# Patient Record
Sex: Female | Born: 1950 | Race: White | Hispanic: No | Marital: Married | Smoking: Never smoker
Health system: Southern US, Community
[De-identification: ages and names within clinical notes are randomized; demographics above are authoritative.]

## PROBLEM LIST (undated history)

## (undated) DIAGNOSIS — T7840XA Allergy, unspecified, initial encounter: Secondary | ICD-10-CM

## (undated) DIAGNOSIS — F419 Anxiety disorder, unspecified: Secondary | ICD-10-CM

## (undated) HISTORY — DX: Anxiety disorder, unspecified: F41.9

## (undated) HISTORY — DX: Allergy, unspecified, initial encounter: T78.40XA

## (undated) HISTORY — PX: EYE SURGERY: SHX253

---

## 2001-08-06 ENCOUNTER — Other Ambulatory Visit: Admission: RE | Admit: 2001-08-06 | Discharge: 2001-08-06 | Payer: Self-pay | Admitting: Family Medicine

## 2004-05-30 HISTORY — PX: APPENDECTOMY: SHX54

## 2005-03-09 ENCOUNTER — Ambulatory Visit: Payer: Self-pay | Admitting: Family Medicine

## 2005-03-14 LAB — HM DEXA SCAN

## 2005-06-03 ENCOUNTER — Ambulatory Visit: Payer: Self-pay

## 2005-06-03 ENCOUNTER — Inpatient Hospital Stay: Payer: Self-pay | Admitting: General Surgery

## 2005-07-13 ENCOUNTER — Ambulatory Visit: Payer: Self-pay | Admitting: General Surgery

## 2006-04-04 ENCOUNTER — Ambulatory Visit: Payer: Self-pay | Admitting: Family Medicine

## 2007-04-23 ENCOUNTER — Ambulatory Visit: Payer: Self-pay | Admitting: Family Medicine

## 2008-06-03 ENCOUNTER — Ambulatory Visit: Payer: Self-pay | Admitting: Family Medicine

## 2009-10-07 ENCOUNTER — Ambulatory Visit: Payer: Self-pay | Admitting: Family Medicine

## 2011-01-25 ENCOUNTER — Ambulatory Visit: Payer: Self-pay | Admitting: Family Medicine

## 2011-11-04 LAB — HM PAP SMEAR: HM Pap smear: NEGATIVE

## 2012-01-26 ENCOUNTER — Ambulatory Visit: Payer: Self-pay | Admitting: Family Medicine

## 2012-01-31 ENCOUNTER — Ambulatory Visit: Payer: Self-pay | Admitting: Family Medicine

## 2013-02-04 ENCOUNTER — Ambulatory Visit: Payer: Self-pay | Admitting: Family Medicine

## 2013-02-13 ENCOUNTER — Ambulatory Visit: Payer: Self-pay | Admitting: Family Medicine

## 2013-08-30 ENCOUNTER — Ambulatory Visit: Payer: Self-pay | Admitting: Family Medicine

## 2014-04-22 ENCOUNTER — Ambulatory Visit: Payer: Self-pay | Admitting: Family Medicine

## 2014-04-22 LAB — HM MAMMOGRAPHY

## 2014-09-25 LAB — HM COLONOSCOPY

## 2015-01-14 ENCOUNTER — Other Ambulatory Visit: Payer: Self-pay | Admitting: Family Medicine

## 2015-01-15 NOTE — Telephone Encounter (Signed)
This is a pt of Dr. Santiago Bur  Last ov was on 04/02/2014 and the next ov appointment is on 04/06/2015.  Thanks,

## 2015-01-21 ENCOUNTER — Other Ambulatory Visit: Payer: Self-pay

## 2015-01-21 DIAGNOSIS — G4700 Insomnia, unspecified: Secondary | ICD-10-CM

## 2015-01-21 DIAGNOSIS — E7800 Pure hypercholesterolemia, unspecified: Secondary | ICD-10-CM | POA: Insufficient documentation

## 2015-01-21 DIAGNOSIS — K5790 Diverticulosis of intestine, part unspecified, without perforation or abscess without bleeding: Secondary | ICD-10-CM | POA: Insufficient documentation

## 2015-01-21 DIAGNOSIS — F419 Anxiety disorder, unspecified: Secondary | ICD-10-CM | POA: Insufficient documentation

## 2015-01-21 MED ORDER — ZOLPIDEM TARTRATE 10 MG PO TABS: 5.0000 mg | ORAL_TABLET | Freq: Every day | ORAL | Status: DC

## 2015-01-21 NOTE — Telephone Encounter (Signed)
Last OV 04/02/2014

## 2015-04-06 ENCOUNTER — Ambulatory Visit (INDEPENDENT_AMBULATORY_CARE_PROVIDER_SITE_OTHER): Payer: BC Managed Care – PPO | Admitting: Family Medicine

## 2015-04-06 ENCOUNTER — Encounter: Payer: Self-pay | Admitting: Family Medicine

## 2015-04-06 VITALS — BP 106/74 | HR 68 | Temp 97.4°F | Resp 16 | Ht 61.0 in | Wt 110.0 lb

## 2015-04-06 DIAGNOSIS — Z0000 Encounter for general adult medical examination without abnormal findings: Secondary | ICD-10-CM | POA: Diagnosis not present

## 2015-04-06 DIAGNOSIS — E7800 Pure hypercholesterolemia, unspecified: Secondary | ICD-10-CM

## 2015-04-06 DIAGNOSIS — Z1159 Encounter for screening for other viral diseases: Secondary | ICD-10-CM

## 2015-04-06 LAB — POCT URINALYSIS DIPSTICK
Bilirubin, UA: NEGATIVE
GLUCOSE UA: NEGATIVE
KETONES UA: NEGATIVE
Leukocytes, UA: NEGATIVE
NITRITE UA: NEGATIVE
PH UA: 6
PROTEIN UA: NEGATIVE
RBC UA: NEGATIVE
Spec Grav, UA: 1.02
UROBILINOGEN UA: 0.2

## 2015-04-06 NOTE — Progress Notes (Signed)
Patient ID: JUDYTH DEMARAIS, female   DOB: 07/02/1950, 64 y.o.   MRN: [id number]       Patient: Robin Stephens, Female    DOB: Jul 16, 1950, 76 y.o.   MRN: [id number] Visit Date: 04/06/2015  Today's Provider: Lorie Phenix, MD   Chief Complaint  Patient presents with  . Annual Exam   Subjective:    Annual physical exam Robin Stephens is a 64 y.o. female who presents today for health maintenance and complete physical. She feels well. She reports exercising daily. She reports she is sleeping fairly well.  Patient reports that she does have some issues sleeping. Patient reports that she sleeps better with 1/2 tablet of Ambien.  Sometimes leaves a quarter of tab by her bedside to take as needed. This works without any grogginess.   ----------------------------------------------------------------- Last: Colonoscopy- 09/25/2014, normal  Pap- 11/04/2011, normal; HPV negative.  Tdap- 01/01/2013  Zoster- 11/28/2010  BMD- 03/14/2005, normal.  Mammogram- 04/22/2014, normal.    Review of Systems  Constitutional: Negative.   HENT: Negative.   Eyes: Negative.   Respiratory: Negative.   Cardiovascular: Negative.   Gastrointestinal: Negative.   Endocrine: Negative.   Genitourinary: Negative.   Musculoskeletal: Negative.   Skin: Negative.   Allergic/Immunologic: Negative.   Neurological: Negative.   Hematological: Negative.   Psychiatric/Behavioral: Negative.     Social History She  reports that she has never smoked. She does not have any smokeless tobacco history on file. She reports that she does not drink alcohol or use illicit drugs. Social History   Social History  . Marital Status: Married    Spouse Name: N/A  . Number of Children: N/A  . Years of Education: 12   Occupational History  . retired [employer]   Social History Main Topics  . Smoking status: Never Smoker   . Smokeless tobacco: None  . Alcohol Use: No  . Drug Use: No  . Sexual Activity: Not Asked    Other Topics Concern  . None   Social History Narrative    Patient Active Problem List   Diagnosis Date Noted  . Anxiety 01/21/2015  . 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 Hypercholesteremia 01/21/2015  . Cannot sleep 01/21/2015    Past Surgical History  Procedure Laterality Date  . Appendectomy  2006    Family History  Family Status  Relation Status Death Age  . Mother Deceased   . Father Deceased   . Sister Deceased   . Sister Alive    Her family history includes Cancer in her sister; Heart disease in her father; Obesity in her sister.    Allergies  Allergen Reactions  . Sulfa Antibiotics Rash    Previous Medications   CALCIUM CARBONATE (OS-CAL) 600 MG TABS TABLET    Take by mouth.   COENZYME Q10 (CO Q10) 100 MG CAPS    Take by mouth.   GINGER, ZINGIBER OFFICINALIS, 550 MG CAPS    Take by mouth.   LACTOBACILLUS (ACIDOPHILUS) 100 MG CAPS    Take by mouth.   MULTIPLE VITAMINS PO    Take by mouth.   OMEGA-3 FATTY ACIDS (FISH OIL) 1200 MG CAPS    Take by mouth.   TRAZODONE (DESYREL) 100 MG TABLET       TRETINOIN, FACIAL WRINKLES, (TRETINOIN, EMOLLIENT,) 0.05 % CREA       VITAMIN C (ASCORBIC ACID) 500 MG TABLET    Take by mouth.   ZOLPIDEM (AMBIEN) 10 MG TABLET    Take  0.5 tablets (5 mg total) by mouth at bedtime.    Patient Care Team: Lorie PhenixNancy Laquashia Mergenthaler, MD as PCP - General (Family Medicine)     Objective:   Vitals: BP 106/74 mmHg  Pulse 68  Temp(Src) 97.4 F (36.3 C)  Resp 16  Ht 5\' 1"  (1.549 m)  Wt 110 lb (49.896 kg)  BMI 20.80 kg/m2   Physical Exam  Constitutional: She is oriented to person, place, and time. She appears well-developed and well-nourished.  HENT:  Head: Normocephalic and atraumatic.  Right Ear: External ear normal.  Left Ear: External ear normal.  Nose: Nose normal.  Mouth/Throat: Oropharynx is clear and moist.  Eyes: Conjunctivae and EOM are normal. Pupils are equal, round, and reactive to light.  Neck: Normal range of motion.  Neck supple.  Cardiovascular: Normal rate, regular rhythm, normal heart sounds and intact distal pulses.   Pulmonary/Chest: Effort normal and breath sounds normal.  Abdominal: Soft. Bowel sounds are normal.  Genitourinary:  Not examined. Pap was normal in 2013 and Colonoscopy was normal in 08/2014.   Musculoskeletal: Normal range of motion.  Neurological: She is alert and oriented to person, place, and time. She has normal reflexes.  Skin: Skin is warm and dry.  Psychiatric: She has a normal mood and affect. Her behavior is normal. Judgment and thought content normal.  Nursing note and vitals reviewed.       Assessment & Plan:     Routine Health Maintenance and Physical Exam  Exercise Activities and Dietary recommendations Goals    None       There is no immunization history on file for this patient.  Health Maintenance  Topic Date Due  . Hepatitis C Screening  1950-06-06  . HIV Screening  06/10/1967  . TETANUS/TDAP  06/10/1971  . PAP SMEAR  06/09/1973  . MAMMOGRAM  06/09/2002  . COLONOSCOPY  06/09/2002  . ZOSTAVAX  06/09/2012  . INFLUENZA VACCINE  02/28/2016 (Originally 12/29/2014)      Discussed health benefits of physical activity, and encouraged her to engage in regular exercise appropriate for her age and condition.   1. Annual physical exam As above.   - CBC with Differential/Platelet - TSH - POCT urinalysis dipstick Results for orders placed or performed in visit on 04/06/15  POCT urinalysis dipstick  Result Value Ref Range   Color, UA yellow    Clarity, UA clear    Glucose, UA negative    Bilirubin, UA negative    Ketones, UA negative    Spec Grav, UA 1.020    Blood, UA negative    pH, UA 6.0    Protein, UA negative    Urobilinogen, UA 0.2    Nitrite, UA negative    Leukocytes, UA Negative Negative    2. Need for hepatitis C screening test Ordered.  - Hepatitis C antibody  3. Hypercholesteremia Will check labs.  - Lipid panel -  Comprehensive metabolic panel   Patient was seen and examined by Leo GrosserNancy J. Fleeta Kunde, MD, and scribed by Anson Oregonachelle Presley, CMA.  I have reviewed the document for accuracy and completeness and I agree with above. Leo Grosser- Tyeler Goedken J. Avery Klingbeil, MD   Lorie PhenixNancy Tsugio Elison, MD  --------------------------------------------------------------------

## 2015-04-09 ENCOUNTER — Telehealth: Payer: Self-pay

## 2015-04-09 LAB — LIPID PANEL
Chol/HDL Ratio: 2.7 ratio units (ref 0.0–4.4)
Cholesterol, Total: 250 mg/dL — ABNORMAL HIGH (ref 100–199)
HDL: 92 mg/dL (ref >39)
LDL CALC: 133 mg/dL — AB (ref 0–99)
Triglycerides: 124 mg/dL (ref 0–149)
VLDL CHOLESTEROL CAL: 25 mg/dL (ref 5–40)

## 2015-04-09 LAB — COMPREHENSIVE METABOLIC PANEL
A/G RATIO: 2 (ref 1.1–2.5)
ALK PHOS: 56 IU/L (ref 39–117)
ALT: 19 IU/L (ref 0–32)
AST: 21 IU/L (ref 0–40)
Albumin: 4.5 g/dL (ref 3.6–4.8)
BILIRUBIN TOTAL: 0.6 mg/dL (ref 0.0–1.2)
BUN / CREAT RATIO: 26 (ref 11–26)
BUN: 18 mg/dL (ref 8–27)
CHLORIDE: 101 mmol/L (ref 97–106)
CO2: 26 mmol/L (ref 18–29)
Calcium: 9.4 mg/dL (ref 8.7–10.3)
Creatinine, Ser: 0.69 mg/dL (ref 0.57–1.00)
GFR calc non Af Amer: 94 mL/min/{1.73_m2} (ref >59)
GFR, EST AFRICAN AMERICAN: 108 mL/min/{1.73_m2} (ref >59)
GLUCOSE: 94 mg/dL (ref 65–99)
Globulin, Total: 2.2 g/dL (ref 1.5–4.5)
POTASSIUM: 4.3 mmol/L (ref 3.5–5.2)
Sodium: 141 mmol/L (ref 136–144)
TOTAL PROTEIN: 6.7 g/dL (ref 6.0–8.5)

## 2015-04-09 LAB — CBC WITH DIFFERENTIAL/PLATELET
BASOS: 1 %
Basophils Absolute: 0 10*3/uL (ref 0.0–0.2)
EOS (ABSOLUTE): 0.1 10*3/uL (ref 0.0–0.4)
EOS: 3 %
HEMATOCRIT: 42.4 % (ref 34.0–46.6)
HEMOGLOBIN: 14.1 g/dL (ref 11.1–15.9)
IMMATURE GRANS (ABS): 0 10*3/uL (ref 0.0–0.1)
IMMATURE GRANULOCYTES: 0 %
LYMPHS: 36 %
Lymphocytes Absolute: 1.4 10*3/uL (ref 0.7–3.1)
MCH: 31.5 pg (ref 26.6–33.0)
MCHC: 33.3 g/dL (ref 31.5–35.7)
MCV: 95 fL (ref 79–97)
MONOCYTES: 14 %
Monocytes Absolute: 0.5 10*3/uL (ref 0.1–0.9)
NEUTROS ABS: 1.8 10*3/uL (ref 1.4–7.0)
NEUTROS PCT: 46 %
Platelets: 238 10*3/uL (ref 150–379)
RBC: 4.47 x10E6/uL (ref 3.77–5.28)
RDW: 13.3 % (ref 12.3–15.4)
WBC: 3.8 10*3/uL (ref 3.4–10.8)

## 2015-04-09 LAB — HEPATITIS C ANTIBODY

## 2015-04-09 LAB — TSH: TSH: 1.48 u[IU]/mL (ref 0.450–4.500)

## 2015-04-09 NOTE — Telephone Encounter (Signed)
Pt advised.   Thanks,   -Laura  

## 2015-04-09 NOTE — Telephone Encounter (Signed)
-----   Message from Lorie PhenixNancy Maloney, MD sent at 04/09/2015  3:02 PM EST ----- Labs stable. Cholesterol  Is elevated at 250 but has such high good cholesterol that 10 year risk of heart disease is only 3 percent. Thanks.

## 2015-06-12 ENCOUNTER — Other Ambulatory Visit: Payer: Self-pay | Admitting: Family Medicine

## 2015-06-12 DIAGNOSIS — Z1231 Encounter for screening mammogram for malignant neoplasm of breast: Secondary | ICD-10-CM

## 2015-06-25 ENCOUNTER — Ambulatory Visit
Admission: RE | Admit: 2015-06-25 | Discharge: 2015-06-25 | Disposition: A | Discharge status: Home / Self Care | Payer: BC Managed Care – PPO | Source: Ambulatory Visit | Attending: Family Medicine | Admitting: Family Medicine

## 2015-06-25 DIAGNOSIS — Z1231 Encounter for screening mammogram for malignant neoplasm of breast: Secondary | ICD-10-CM | POA: Insufficient documentation

## 2015-07-26 ENCOUNTER — Other Ambulatory Visit: Payer: Self-pay | Admitting: Family Medicine

## 2015-07-26 DIAGNOSIS — G4700 Insomnia, unspecified: Secondary | ICD-10-CM

## 2015-07-27 NOTE — Telephone Encounter (Signed)
Printed, please fax or call in to pharmacy. Thank you.   

## 2015-08-21 ENCOUNTER — Encounter: Payer: Self-pay | Admitting: Family Medicine

## 2015-11-09 ENCOUNTER — Ambulatory Visit (INDEPENDENT_AMBULATORY_CARE_PROVIDER_SITE_OTHER): Payer: BC Managed Care – PPO | Admitting: Physician Assistant

## 2015-11-09 ENCOUNTER — Encounter: Payer: Self-pay | Admitting: Physician Assistant

## 2015-11-09 VITALS — BP 160/80 | HR 75 | Temp 98.1°F | Resp 16 | Wt 112.8 lb

## 2015-11-09 DIAGNOSIS — B079 Viral wart, unspecified: Secondary | ICD-10-CM

## 2015-11-09 NOTE — Progress Notes (Signed)
       Patient: Robin Stephens Female    DOB: Oct 18, 1950   65 y.o.   MRN: [id number] Visit Date: 11/09/2015  Today's Provider: Margaretann LovelessJennifer M Burnette, PA-C   Chief Complaint  Patient presents with  . Spots on legs and shoulder   Subjective:    HPI  Patient is here today with c/o of having some rough spots on her inner right leg, left shoulder and her back. She feels they are small warts and she has been trying to treat with compound W bandages. She feels the small one on the left shoulder has responded but the one on the right inner thigh has not. She is now starting to get irritation from the adhesive of the bandages on the inner thigh.     Allergies  Allergen Reactions  . Sulfa Antibiotics Rash   Current Meds  Medication Sig  . calcium carbonate (OS-CAL) 600 MG TABS tablet Take by mouth.  . Coenzyme Q10 (CO Q10) 100 MG CAPS Take by mouth.  . Ginger, Zingiber officinalis, 550 MG CAPS Take by mouth.  . Lactobacillus (ACIDOPHILUS) 100 MG CAPS Take by mouth.  . MULTIPLE VITAMINS PO Take by mouth.  . Omega-3 Fatty Acids (FISH OIL) 1200 MG CAPS Take by mouth.  . vitamin C (ASCORBIC ACID) 500 MG tablet Take by mouth.  . zolpidem (AMBIEN) 10 MG tablet take 1/2 tablet by mouth at bedtime    Review of Systems  Constitutional: Negative.   Respiratory: Negative.   Cardiovascular: Negative.   Gastrointestinal: Negative.   Musculoskeletal: Negative.   Skin:       Rough areas on left shoulder, right inner thigh and left low back    Social History  Substance Use Topics  . Smoking status: Never Smoker   . Smokeless tobacco: Not on file  . Alcohol Use: No   Objective:   BP 160/80 mmHg  Pulse 75  Temp(Src) 98.1 F (36.7 C) (Oral)  Resp 16  Wt 112 lb 12.8 oz (51.166 kg)  Physical Exam  Constitutional: She appears well-developed and well-nourished. No distress.  Cardiovascular: Normal rate, regular rhythm and normal heart sounds.  Exam reveals no gallop and no friction rub.     No murmur heard. Pulmonary/Chest: Effort normal and breath sounds normal. No respiratory distress. She has no wheezes. She has no rales.  Skin: She is not diaphoretic.     Vitals reviewed.      Assessment & Plan:     1. Wart She has been using compound W bandages over the left shoulder and right thigh without much relief. Cryotherapy was used on the 3 lesions. She was advised to call if they return or do not completely go away.      Margaretann LovelessJennifer M Burnette, PA-C  [facility]

## 2015-11-09 NOTE — Patient Instructions (Signed)
Warts Warts are small growths on the skin. They are common and can occur on various areas of the body. A person may have one wart or multiple warts. Most warts are not painful, and they usually do not cause problems. However, warts can cause pain if they are large or occur in an area of the body where pressure will be applied to them, such as the bottom of the foot. In many cases, warts do not require treatment. They usually go away on their own over a period of many months to a couple years. Various treatments may be done for warts that cause problems or do not go away. Sometimes, warts go away and then come back again. CAUSES Warts are caused by a type of virus that is called human papillomavirus (HPV). This virus can spread from person to person through direct contact. Warts can also spread to other areas of the body when a person scratches a wart and then scratches another area of his or her body.  RISK FACTORS Warts are more likely to develop in:  People who are 10-20 years of age.  People who have a weakened body defense system (immune system). SYMPTOMS A wart may be round or oval or have an irregular shape. Most warts have a rough surface. Warts may range in color from skin color to light yellow, brown, or gray. They are generally less than  inch (1.3 cm) in size. Most warts are painless, but some can be painful when pressure is applied to them. DIAGNOSIS A wart can usually be diagnosed from its appearance. In some cases, a tissue sample may be removed (biopsy) to be looked at under a microscope. TREATMENT In many cases, warts do not need treatment. If treatment is needed, options may include:  Applying medicated solutions, creams, or patches to the wart. These may be over-the-counter or prescription medicines that make the skin soft so that layers will gradually shed away. In many cases, the medicine is applied one or two times per day and covered with a bandage.  Putting duct tape over  the top of the wart (occlusion). You will leave the tape in place for as long as told by your health care provider, then you will replace it with a new strip of tape. This is done until the wart goes away.  Freezing the wart with liquid nitrogen (cryotherapy).  Burning the wart with:  Laser treatment.  An electrified probe (electrocautery).  Injection of a medicine (Candida antigen) into the wart to help the body's immune system to fight off the wart.  Surgery to remove the wart. HOME CARE INSTRUCTIONS  Apply over-the-counter and prescription medicines only as told by your health care provider.  Do not apply over-the-counter wart medicines to your face or genitals before you ask your health care provider if it is okay to do so.  Do not scratch or pick at a wart.  Wash your hands after you touch a wart.  Avoid shaving hair that is over a wart.  Keep all follow-up visits as told by your health care provider. This is important. SEEK MEDICAL CARE IF:  Your warts do not improve after treatment.  You have redness, swelling, or pain at the site of a wart.  You have bleeding from a wart that does not stop with light pressure.  You have diabetes and you develop a wart.   This information is not intended to replace advice given to you by your health care provider. Make sure   you discuss any questions you have with your health care provider.   Document Released: 02/23/2005 Document Revised: 02/04/2015 Document Reviewed: 08/11/2014  Cryotherapy Cryotherapy means treatment with cold. Ice or gel packs can be used to reduce both pain and swelling. Ice is the most helpful within the first 24 to 48 hours after an injury or flare-up from overusing a muscle or joint. Sprains, strains, spasms, burning pain, shooting pain, and aches can all be eased with ice. Ice can also be used when recovering from surgery. Ice is effective, has very few side effects, and is safe for most people to  use. PRECAUTIONS  Ice is not a safe treatment option for people with:  Raynaud phenomenon. This is a condition affecting small blood vessels in the extremities. Exposure to cold may cause your problems to return.  Cold hypersensitivity. There are many forms of cold hypersensitivity, including:  Cold urticaria. Red, itchy hives appear on the skin when the tissues begin to warm after being iced.  Cold erythema. This is a red, itchy rash caused by exposure to cold.  Cold hemoglobinuria. Red blood cells break down when the tissues begin to warm after being iced. The hemoglobin that carry oxygen are passed into the urine because they cannot combine with blood proteins fast enough.  Numbness or altered sensitivity in the area being iced. If you have any of the following conditions, do not use ice until you have discussed cryotherapy with your caregiver:  Heart conditions, such as arrhythmia, angina, or chronic heart disease.  High blood pressure.  Healing wounds or open skin in the area being iced.  Current infections.  Rheumatoid arthritis.  Poor circulation.  Diabetes. Ice slows the blood flow in the region it is applied. This is beneficial when trying to stop inflamed tissues from spreading irritating chemicals to surrounding tissues. However, if you expose your skin to cold temperatures for too long or without the proper protection, you can damage your skin or nerves. Watch for signs of skin damage due to cold. HOME CARE INSTRUCTIONS Follow these tips to use ice and cold packs safely.  Place a dry or damp towel between the ice and skin. A damp towel will cool the skin more quickly, so you may need to shorten the time that the ice is used.  For a more rapid response, add gentle compression to the ice.  Ice for no more than 10 to 20 minutes at a time. The bonier the area you are icing, the less time it will take to get the benefits of ice.  Check your skin after 5 minutes to make  sure there are no signs of a poor response to cold or skin damage.  Rest 20 minutes or more between uses.  Once your skin is numb, you can end your treatment. You can test numbness by very lightly touching your skin. The touch should be so light that you do not see the skin dimple from the pressure of your fingertip. When using ice, most people will feel these normal sensations in this order: cold, burning, aching, and numbness.  Do not use ice on someone who cannot communicate their responses to pain, such as small children or people with dementia. HOW TO MAKE AN ICE PACK Ice packs are the most common way to use ice therapy. Other methods include ice massage, ice baths, and cryosprays. Muscle creams that cause a cold, tingly feeling do not offer the same benefits that ice offers and should not be used  as a substitute unless recommended by your caregiver. To make an ice pack, do one of the following:  Place crushed ice or a bag of frozen vegetables in a sealable plastic bag. Squeeze out the excess air. Place this bag inside another plastic bag. Slide the bag into a pillowcase or place a damp towel between your skin and the bag.  Mix 3 parts water with 1 part rubbing alcohol. Freeze the mixture in a sealable plastic bag. When you remove the mixture from the freezer, it will be slushy. Squeeze out the excess air. Place this bag inside another plastic bag. Slide the bag into a pillowcase or place a damp towel between your skin and the bag. SEEK MEDICAL CARE IF:  You develop white spots on your skin. This may give the skin a blotchy (mottled) appearance.  Your skin turns blue or pale.  Your skin becomes waxy or hard.  Your swelling gets worse. MAKE SURE YOU:   Understand these instructions.  Will watch your condition.  Will get help right away if you are not doing well or get worse.   This information is not intended to replace advice given to you by your health care provider. Make sure  you discuss any questions you have with your health care provider.   Document Released: 01/10/2011 Document Revised: 06/06/2014 Document Reviewed: 01/10/2011 Elsevier Interactive Patient Education 2016 [employer]  [employer]vier Interactive Patient Education [employer]

## 2015-11-18 ENCOUNTER — Encounter: Payer: Self-pay | Admitting: Family Medicine

## 2016-01-18 ENCOUNTER — Other Ambulatory Visit: Payer: Self-pay | Admitting: Family Medicine

## 2016-01-18 DIAGNOSIS — G4700 Insomnia, unspecified: Secondary | ICD-10-CM

## 2016-01-23 ENCOUNTER — Other Ambulatory Visit: Payer: Self-pay | Admitting: Family Medicine

## 2016-01-23 DIAGNOSIS — G4700 Insomnia, unspecified: Secondary | ICD-10-CM

## 2016-01-25 NOTE — Telephone Encounter (Signed)
Jenni's Pt.   Thanks,   -Kennie Karapetian  

## 2016-04-07 ENCOUNTER — Encounter: Payer: BC Managed Care – PPO | Admitting: Physician Assistant

## 2016-06-28 ENCOUNTER — Encounter: Payer: Self-pay | Admitting: Physician Assistant

## 2016-06-28 ENCOUNTER — Ambulatory Visit (INDEPENDENT_AMBULATORY_CARE_PROVIDER_SITE_OTHER): Payer: BC Managed Care – PPO | Admitting: Physician Assistant

## 2016-06-28 VITALS — BP 140/70 | HR 78 | Temp 97.8°F | Resp 16 | Ht 60.0 in | Wt 111.4 lb

## 2016-06-28 DIAGNOSIS — Z1231 Encounter for screening mammogram for malignant neoplasm of breast: Secondary | ICD-10-CM | POA: Diagnosis not present

## 2016-06-28 DIAGNOSIS — E7800 Pure hypercholesterolemia, unspecified: Secondary | ICD-10-CM

## 2016-06-28 DIAGNOSIS — Z0000 Encounter for general adult medical examination without abnormal findings: Secondary | ICD-10-CM

## 2016-06-28 DIAGNOSIS — R030 Elevated blood-pressure reading, without diagnosis of hypertension: Secondary | ICD-10-CM | POA: Diagnosis not present

## 2016-06-28 DIAGNOSIS — Z124 Encounter for screening for malignant neoplasm of cervix: Secondary | ICD-10-CM

## 2016-06-28 DIAGNOSIS — Z1239 Encounter for other screening for malignant neoplasm of breast: Secondary | ICD-10-CM

## 2016-06-28 NOTE — Progress Notes (Signed)
Patient: Robin Stephens, Female    DOB: 12/07/1950, 66 y.o.   MRN: [id number] Visit Date: 06/28/2016  Today's Provider: Trinna Post, PA-C   Chief Complaint  Patient presents with  . Annual Exam   Subjective:    Annual physical exam Robin Stephens is a 66 y.o. female who presents today for health maintenance and complete physical. She feels well. She reports exercising. She reports she is sleeping fairly well with PRN 1/2 tablet of Ambien.  She lives in [place] with her husband of 76 years. She has two adult children, a son in [place] and a daughter in [place]. She also has two grandchildren ages 24 and 2.  She does not and has never smoked. She drinks a glass of wine 3-4 nights per week with dinner.  She feels well except for her insomnia which has been a chronic issue for her well managed with PRN Ambien. No morning hangover, no nighttime awakenings.  Family history of breast cancer of her mother, deceased of this age 70, and also positive in her sister, who had a lumpectomy. No family history of colon cancer.    Colonoscopy:09/25/14-Normal Mammogram: 06/26/15- BI-RADS 1 BMD: 03/15/15 Normal Pap: 11/04/11 Normal HPV-Negative Tdap: 01/01/13 Zoster: 11/28/10  -----------------------------------------------------------------   Review of Systems  Constitutional: Negative.   HENT: Negative.   Eyes: Negative.   Respiratory: Negative.   Cardiovascular: Negative.   Gastrointestinal: Negative.   Endocrine: Negative.   Genitourinary: Negative.   Musculoskeletal: Negative.   Skin: Negative.   Allergic/Immunologic: Negative.   Neurological: Negative.   Hematological: Negative.   Psychiatric/Behavioral: Negative.     Social History      She  reports that she has never smoked. She has never used smokeless tobacco. She reports that she drinks alcohol. She reports that she does not use drugs.       Social History   Social History  . Marital status: Married   Spouse name: N/A  . Number of children: N/A  . Years of education: 62   Occupational History  . retired [employer] History Main Topics  . Smoking status: Never Smoker  . Smokeless tobacco: Never Used  . Alcohol use 0.0 oz/week  . Drug use: No  . Sexual activity: Not Asked   Other Topics Concern  . None   Social History Narrative  . None    History reviewed. No pertinent past medical history.   Patient Active Problem List   Diagnosis Date Noted  . Anxiety 01/21/2015  . 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 Hypercholesteremia 01/21/2015  . Insomnia 01/21/2015    Past Surgical History:  Procedure Laterality Date  . APPENDECTOMY  2006    Family History        Family Status  Relation Status  . Father Deceased  . Sister Alive  . Mother Deceased  . Sister Deceased        Her family history includes Breast cancer (age of onset: 62) in her mother; Breast cancer (age of onset: 35) in her sister; Cancer in her sister; Heart disease in her father; Obesity in her sister.     Allergies  Allergen Reactions  . Sulfa Antibiotics Rash     Current Outpatient Prescriptions:  .  ASHWAGANDHA PO, Take 460 mg by mouth., Disp: , Rfl:  .  calcium carbonate (OS-CAL) 600 MG TABS tablet, Take by mouth., Disp: , Rfl:  .  Coenzyme Q10 (CO Q10) 100  MG CAPS, Take by mouth., Disp: , Rfl:  .  Garlic 1962 MG CAPS, Take by mouth., Disp: , Rfl:  .  Ginger, Zingiber officinalis, 550 MG CAPS, Take by mouth., Disp: , Rfl:  .  Lactobacillus (ACIDOPHILUS) 100 MG CAPS, Take by mouth., Disp: , Rfl:  .  MULTIPLE VITAMINS PO, Take by mouth., Disp: , Rfl:  .  Omega-3 Fatty Acids (FISH OIL) 1200 MG CAPS, Take by mouth., Disp: , Rfl:  .  vitamin C (ASCORBIC ACID) 500 MG tablet, Take by mouth., Disp: , Rfl:  .  zolpidem (AMBIEN) 10 MG tablet, take 1/2 tablet by mouth at bedtime, Disp: 15 tablet, Rfl: 5   Patient Care Team: Mar Daring, PA-C as PCP - General (Family  Medicine)      Objective:   Vitals: BP 140/70 (BP Location: Left Arm, Patient Position: Sitting, Cuff Size: Normal)   Pulse 78   Temp 97.8 F (36.6 C) (Oral)   Resp 16   Ht 5' (1.524 m)   Wt 111 lb 6.4 oz (50.5 kg)   BMI 21.76 kg/m    Physical Exam  Constitutional: She is oriented to person, place, and time. She appears well-developed and well-nourished.  HENT:  Mouth/Throat: Oropharynx is clear and moist. No oropharyngeal exudate.  Eyes: Conjunctivae are normal.  Neck: Neck supple.  Cardiovascular: Normal rate, regular rhythm and normal heart sounds.   Pulmonary/Chest: Effort normal and breath sounds normal. No respiratory distress. She has no wheezes. She has no rales. She exhibits no tenderness. Right breast exhibits no inverted nipple, no mass, no nipple discharge, no skin change and no tenderness. Left breast exhibits no inverted nipple, no mass, no nipple discharge, no skin change and no tenderness. Breasts are symmetrical.  Abdominal: Soft. Bowel sounds are normal. She exhibits no distension and no mass. There is no tenderness. There is no rebound and no guarding.  Genitourinary: No breast swelling, tenderness, discharge or bleeding. No labial fusion. There is no rash, tenderness, lesion or injury on the right labia. There is no rash, tenderness, lesion or injury on the left labia. Uterus is not deviated, not enlarged, not fixed and not tender. Cervix exhibits no motion tenderness, no discharge and no friability. Right adnexum displays no mass, no tenderness and no fullness. Left adnexum displays no mass, no tenderness and no fullness. No erythema, tenderness or bleeding in the vagina. No foreign body in the vagina. No signs of injury around the vagina. No vaginal discharge found.  Genitourinary Comments: Chaperoned pelvic and breast exam   Lymphadenopathy:    She has no cervical adenopathy.  Neurological: She is alert and oriented to person, place, and time.  Skin: Skin is warm  and dry.  Psychiatric: She has a normal mood and affect. Her behavior is normal.     Depression Screen PHQ 2/9 Scores 06/28/2016  PHQ - 2 Score 0      Assessment & Plan:     Routine Health Maintenance and Physical Exam  Exercise Activities and Dietary recommendations Goals    None      Immunization History  Administered Date(s) Administered  . Td 10/17/2003  . Tdap 01/01/2013    Health Maintenance  Topic Date Due  . HIV Screening  06/10/1967  . ZOSTAVAX  06/09/2012  . PAP SMEAR  11/04/2014  . INFLUENZA VACCINE  12/29/2015  . MAMMOGRAM  06/24/2017  . TETANUS/TDAP  01/02/2023  . COLONOSCOPY  09/24/2024  . Hepatitis C Screening  Completed  Discussed health benefits of physical activity, and encouraged her to engage in regular exercise appropriate for her age and condition.    1. Annual physical exam  Labs as below.   - CBC with Differential/Platelet  2. Hypercholesteremia   - Comprehensive metabolic panel - Hemoglobin A1c - Lipid panel  3. Elevated blood pressure reading  BP on recheck was 140/80. Elevated BP reading on last visit in 10/2015. Patient says readings are normal at home. Instructed patient that two separated elevated blood pressure readings are technically HTN and would recommend treatment. Discussed long term effects of uncontrolled hypertension. Patient wants to monitor readings at home to see if they remain elevated and schedule a follow up visit if she wants to start medication.   - TSH  4. Breast cancer screening  Ordered and provided info for [place].   - MM DIGITAL SCREENING BILATERAL; Future  5. Cervical cancer screening  Sent of swab as below. If normal, will be last one for screening purposes.  - Pap IG and HPV (high risk) DNA detection  Return in about 1 year (around 06/28/2017) for Annual wellness.  Patient Instructions  Health Maintenance, Female Introduction Adopting a healthy lifestyle and getting preventive  care can go a long way to promote health and wellness. Talk with your health care provider about what schedule of regular examinations is right for you. This is a good chance for you to check in with your provider about disease prevention and staying healthy. In between checkups, there are plenty of things you can do on your own. Experts have done a lot of research about which lifestyle changes and preventive measures are most likely to keep you healthy. Ask your health care provider for more information. Weight and diet Eat a healthy diet  Be sure to include plenty of vegetables, fruits, low-fat dairy products, and lean protein.  Do not eat a lot of foods high in solid fats, added sugars, or salt.  Get regular exercise. This is one of the most important things you can do for your health.  Most adults should exercise for at least 150 minutes each week. The exercise should increase your heart rate and make you sweat (moderate-intensity exercise).  Most adults should also do strengthening exercises at least twice a week. This is in addition to the moderate-intensity exercise. Maintain a healthy weight  Body mass index (BMI) is a measurement that can be used to identify possible weight problems. It estimates body fat based on height and weight. Your health care provider can help determine your BMI and help you achieve or maintain a healthy weight.  For females 19 years of age and older:  A BMI below 18.5 is considered underweight.  A BMI of 18.5 to 24.9 is normal.  A BMI of 25 to 29.9 is considered overweight.  A BMI of 30 and above is considered obese. Watch levels of cholesterol and blood lipids  You should start having your blood tested for lipids and cholesterol at 64 years of age, then have this test every 5 years.  You may need to have your cholesterol levels checked more often if:  Your lipid or cholesterol levels are high.  You are older than 64 years of age.  You are at  high risk for heart disease. Cancer screening Lung Cancer  Lung cancer screening is recommended for adults 40-36 years old who are at high risk for lung cancer because of a history of smoking.  A yearly low-dose CT scan of  the lungs is recommended for people who:  Currently smoke.  Have quit within the past 15 years.  Have at least a 30-pack-year history of smoking. A pack year is smoking an average of one pack of cigarettes a day for 1 year.  Yearly screening should continue until it has been 15 years since you quit.  Yearly screening should stop if you develop a health problem that would prevent you from having lung cancer treatment. Breast Cancer  Practice breast self-awareness. This means understanding how your breasts normally appear and feel.  It also means doing regular breast self-exams. Let your health care provider know about any changes, no matter how small.  If you are in your 20s or 30s, you should have a clinical breast exam (CBE) by a health care provider every 1-3 years as part of a regular health exam.  If you are 75 or older, have a CBE every year. Also consider having a breast X-ray (mammogram) every year.  If you have a family history of breast cancer, talk to your health care provider about genetic screening.  If you are at high risk for breast cancer, talk to your health care provider about having an MRI and a mammogram every year.  Breast cancer gene (BRCA) assessment is recommended for women who have family members with BRCA-related cancers. BRCA-related cancers include:  Breast.  Ovarian.  Tubal.  Peritoneal cancers.  Results of the assessment will determine the need for genetic counseling and BRCA1 and BRCA2 testing. Cervical Cancer  Your health care provider may recommend that you be screened regularly for cancer of the pelvic organs (ovaries, uterus, and vagina). This screening involves a pelvic examination, including checking for microscopic  changes to the surface of your cervix (Pap test). You may be encouraged to have this screening done every 3 years, beginning at age 36.  For women ages 56-65, health care providers may recommend pelvic exams and Pap testing every 3 years, or they may recommend the Pap and pelvic exam, combined with testing for human papilloma virus (HPV), every 5 years. Some types of HPV increase your risk of cervical cancer. Testing for HPV may also be done on women of any age with unclear Pap test results.  Other health care providers may not recommend any screening for nonpregnant women who are considered low risk for pelvic cancer and who do not have symptoms. Ask your health care provider if a screening pelvic exam is right for you.  If you have had past treatment for cervical cancer or a condition that could lead to cancer, you need Pap tests and screening for cancer for at least 20 years after your treatment. If Pap tests have been discontinued, your risk factors (such as having a new sexual partner) need to be reassessed to determine if screening should resume. Some women have medical problems that increase the chance of getting cervical cancer. In these cases, your health care provider may recommend more frequent screening and Pap tests. Colorectal Cancer  This type of cancer can be detected and often prevented.  Routine colorectal cancer screening usually begins at 64 years of age and continues through 64 years of age.  Your health care provider may recommend screening at an earlier age if you have risk factors for colon cancer.  Your health care provider may also recommend using home test kits to check for hidden blood in the stool.  A small camera at the end of a tube can be used to examine your  colon directly (sigmoidoscopy or colonoscopy). This is done to check for the earliest forms of colorectal cancer.  Routine screening usually begins at age 10.  Direct examination of the colon should be  repeated every 5-10 years through 64 years of age. However, you may need to be screened more often if early forms of precancerous polyps or small growths are found. Skin Cancer  Check your skin from head to toe regularly.  Tell your health care provider about any new moles or changes in moles, especially if there is a change in a mole's shape or color.  Also tell your health care provider if you have a mole that is larger than the size of a pencil eraser.  Always use sunscreen. Apply sunscreen liberally and repeatedly throughout the day.  Protect yourself by wearing long sleeves, pants, a wide-brimmed hat, and sunglasses whenever you are outside. Heart disease, diabetes, and high blood pressure  High blood pressure causes heart disease and increases the risk of stroke. High blood pressure is more likely to develop in:  People who have blood pressure in the high end of the normal range (130-139/85-89 mm Hg).  People who are overweight or obese.  People who are African American.  If you are 57-78 years of age, have your blood pressure checked every 3-5 years. If you are 70 years of age or older, have your blood pressure checked every year. You should have your blood pressure measured twice-once when you are at a hospital or clinic, and once when you are not at a hospital or clinic. Record the average of the two measurements. To check your blood pressure when you are not at a hospital or clinic, you can use:  An automated blood pressure machine at a pharmacy.  A home blood pressure monitor.  If you are between 90 years and 64 years old, ask your health care provider if you should take aspirin to prevent strokes.  Have regular diabetes screenings. This involves taking a blood sample to check your fasting blood sugar level.  If you are at a normal weight and have a low risk for diabetes, have this test once every three years after 64 years of age.  If you are overweight and have a high  risk for diabetes, consider being tested at a younger age or more often. Preventing infection Hepatitis B  If you have a higher risk for hepatitis B, you should be screened for this virus. You are considered at high risk for hepatitis B if:  You were born in a country where hepatitis B is common. Ask your health care provider which countries are considered high risk.  Your parents were born in a high-risk country, and you have not been immunized against hepatitis B (hepatitis B vaccine).  You have HIV or AIDS.  You use needles to inject street drugs.  You live with someone who has hepatitis B.  You have had sex with someone who has hepatitis B.  You get hemodialysis treatment.  You take certain medicines for conditions, including cancer, organ transplantation, and autoimmune conditions. Hepatitis C  Blood testing is recommended for:  Everyone born from [birth year] through 1963.  Anyone with known risk factors for hepatitis C. Sexually transmitted infections (STIs)  You should be screened for sexually transmitted infections (STIs) including gonorrhea and chlamydia if:  You are sexually active and are younger than 64 years of age.  You are older than 64 years of age and your health care provider tells you  that you are at risk for this type of infection.  Your sexual activity has changed since you were last screened and you are at an increased risk for chlamydia or gonorrhea. Ask your health care provider if you are at risk.  If you do not have HIV, but are at risk, it may be recommended that you take a prescription medicine daily to prevent HIV infection. This is called pre-exposure prophylaxis (PrEP). You are considered at risk if:  You are sexually active and do not regularly use condoms or know the HIV status of your partner(s).  You take drugs by injection.  You are sexually active with a partner who has HIV. Talk with your health care provider about whether you are at high  risk of being infected with HIV. If you choose to begin PrEP, you should first be tested for HIV. You should then be tested every 3 months for as long as you are taking PrEP. Pregnancy  If you are premenopausal and you may become pregnant, ask your health care provider about preconception counseling.  If you may become pregnant, take 400 to 800 micrograms (mcg) of folic acid every day.  If you want to prevent pregnancy, talk to your health care provider about birth control (contraception). Osteoporosis and menopause  Osteoporosis is a disease in which the bones lose minerals and strength with aging. This can result in serious bone fractures. Your risk for osteoporosis can be identified using a bone density scan.  If you are 45 years of age or older, or if you are at risk for osteoporosis and fractures, ask your health care provider if you should be screened.  Ask your health care provider whether you should take a calcium or vitamin D supplement to lower your risk for osteoporosis.  Menopause may have certain physical symptoms and risks.  Hormone replacement therapy may reduce some of these symptoms and risks. Talk to your health care provider about whether hormone replacement therapy is right for you. Follow these instructions at home:  Schedule regular health, dental, and eye exams.  Stay current with your immunizations.  Do not use any tobacco products including cigarettes, chewing tobacco, or electronic cigarettes.  If you are pregnant, do not drink alcohol.  If you are breastfeeding, limit how much and how often you drink alcohol.  Limit alcohol intake to no more than 1 drink per day for nonpregnant women. One drink equals 12 ounces of beer, 5 ounces of wine, or 1 ounces of hard liquor.  Do not use street drugs.  Do not share needles.  Ask your health care provider for help if you need support or information about quitting drugs.  Tell your health care provider if you  often feel depressed.  Tell your health care provider if you have ever been abused or do not feel safe at home. This information is not intended to replace advice given to you by your health care provider. Make sure you discuss any questions you have with your health care provider. Document Released: 11/29/2010 Document Revised: 10/22/2015 Document Reviewed: 02/17/2015  2017 Elsevier   The entirety of the information documented in the History of Present Illness, Review of Systems and Physical Exam were personally obtained by me. Portions of this information were initially documented by Renalda Locklin, CMA and reviewed by me for thoroughness and accuracy.    --------------------------------------------------------------------    Trinna Post, PA-C  [facility]

## 2016-06-28 NOTE — Patient Instructions (Signed)

## 2016-06-30 LAB — CBC WITH DIFFERENTIAL/PLATELET
Basophils Absolute: 0 10*3/uL (ref 0.0–0.2)
Basos: 0 %
EOS (ABSOLUTE): 0.1 10*3/uL (ref 0.0–0.4)
Eos: 3 %
Hematocrit: 41 % (ref 34.0–46.6)
Hemoglobin: 13.6 g/dL (ref 11.1–15.9)
Immature Grans (Abs): 0 10*3/uL (ref 0.0–0.1)
Immature Granulocytes: 0 %
Lymphocytes Absolute: 1.6 10*3/uL (ref 0.7–3.1)
Lymphs: 33 %
MCH: 31.4 pg (ref 26.6–33.0)
MCHC: 33.2 g/dL (ref 31.5–35.7)
MCV: 95 fL (ref 79–97)
Monocytes Absolute: 0.7 10*3/uL (ref 0.1–0.9)
Monocytes: 14 %
Neutrophils Absolute: 2.4 10*3/uL (ref 1.4–7.0)
Neutrophils: 50 %
Platelets: 245 10*3/uL (ref 150–379)
RBC: 4.33 x10E6/uL (ref 3.77–5.28)
RDW: 13 % (ref 12.3–15.4)
WBC: 4.8 10*3/uL (ref 3.4–10.8)

## 2016-06-30 LAB — COMPREHENSIVE METABOLIC PANEL
ALT: 19 IU/L (ref 0–32)
AST: 23 IU/L (ref 0–40)
Albumin/Globulin Ratio: 1.9 (ref 1.2–2.2)
Albumin: 4.5 g/dL (ref 3.6–4.8)
Alkaline Phosphatase: 58 IU/L (ref 39–117)
BUN/Creatinine Ratio: 24 (ref 12–28)
BUN: 18 mg/dL (ref 8–27)
Bilirubin Total: 0.4 mg/dL (ref 0.0–1.2)
CO2: 26 mmol/L (ref 18–29)
Calcium: 9.2 mg/dL (ref 8.7–10.3)
Chloride: 101 mmol/L (ref 96–106)
Creatinine, Ser: 0.75 mg/dL (ref 0.57–1.00)
GFR calc Af Amer: 97 mL/min/{1.73_m2} (ref >59)
GFR calc non Af Amer: 85 mL/min/{1.73_m2} (ref >59)
Globulin, Total: 2.4 g/dL (ref 1.5–4.5)
Glucose: 102 mg/dL — ABNORMAL HIGH (ref 65–99)
Potassium: 4 mmol/L (ref 3.5–5.2)
Sodium: 141 mmol/L (ref 134–144)
Total Protein: 6.9 g/dL (ref 6.0–8.5)

## 2016-06-30 LAB — LIPID PANEL
Chol/HDL Ratio: 2.8 ratio units (ref 0.0–4.4)
Cholesterol, Total: 263 mg/dL — ABNORMAL HIGH (ref 100–199)
HDL: 93 mg/dL (ref >39)
LDL Calculated: 151 mg/dL — ABNORMAL HIGH (ref 0–99)
Triglycerides: 93 mg/dL (ref 0–149)
VLDL Cholesterol Cal: 19 mg/dL (ref 5–40)

## 2016-06-30 LAB — TSH: TSH: 1.67 u[IU]/mL (ref 0.450–4.500)

## 2016-06-30 LAB — HEMOGLOBIN A1C
Est. average glucose Bld gHb Est-mCnc: 103 mg/dL
Hgb A1c MFr Bld: 5.2 % (ref 4.8–5.6)

## 2016-07-02 LAB — HPV, LOW VOLUME (REFLEX): HPV low volume reflex: NEGATIVE

## 2016-07-02 LAB — PAP IG AND HPV HIGH-RISK: PAP Smear Comment: 0

## 2016-07-04 ENCOUNTER — Telehealth: Payer: Self-pay

## 2016-07-04 NOTE — Telephone Encounter (Signed)
-----   Message from Trey SailorsAdriana M Pollak, [place]PA-C sent at 07/04/2016  1:29 PM EST ----- Pap and HPV were normal and negative. CBC normal. METC  And A1c normal. TSH normal. Total cholesterol is high, though HDL is high as well. Statin not indicated. Would recommend diet/exercise interventions, and monitoring BP at home. If high on home monitorings, would like patient to reconsider BP treatment.

## 2016-07-04 NOTE — Telephone Encounter (Signed)
Advised pt of lab results. Pt verbally acknowledges understanding. Emily Drozdowski, CMA   

## 2016-07-20 ENCOUNTER — Other Ambulatory Visit: Payer: Self-pay | Admitting: Family Medicine

## 2016-07-20 DIAGNOSIS — G4700 Insomnia, unspecified: Secondary | ICD-10-CM

## 2016-07-29 ENCOUNTER — Ambulatory Visit
Admission: RE | Admit: 2016-07-29 | Discharge: 2016-07-29 | Disposition: A | Discharge status: Home / Self Care | Payer: BC Managed Care – PPO | Source: Ambulatory Visit | Attending: Physician Assistant | Admitting: Physician Assistant

## 2016-07-29 DIAGNOSIS — Z1239 Encounter for other screening for malignant neoplasm of breast: Secondary | ICD-10-CM

## 2016-07-29 DIAGNOSIS — Z1231 Encounter for screening mammogram for malignant neoplasm of breast: Secondary | ICD-10-CM | POA: Diagnosis not present

## 2017-01-15 ENCOUNTER — Other Ambulatory Visit: Payer: Self-pay | Admitting: Physician Assistant

## 2017-01-15 DIAGNOSIS — G4700 Insomnia, unspecified: Secondary | ICD-10-CM

## 2017-01-16 NOTE — Telephone Encounter (Signed)
RX called in at [facility] pharmacy  

## 2017-07-03 ENCOUNTER — Telehealth: Payer: Self-pay | Admitting: Physician Assistant

## 2017-07-03 NOTE — Telephone Encounter (Signed)
Pt called back to give the contact # [facility] in the [facility].  Phone# [phone] Fax# [phone]   [email or web address] is the web tool on the letter to print out a PA form.  Please advise. Thanks TNP

## 2017-07-03 NOTE — Telephone Encounter (Signed)
Pt stated that [facility] is [facility] and they sent her a notification via mail stating they are requiring an PA for zolpidem (AMBIEN) 10 MG tablet. Pt is asking that we go ahead with PA b/c she is concerned she will run out of the medication. Pt was seeing Antony ContrasJenni and Antony ContrasJenni wrote pt's last Rx but pt is scheduled to start seeing Dr. Leonard SchwartzB 07/10/17. Please advise. Thanks TNP  [employer] Medicare ID# [id number]

## 2017-07-04 NOTE — Telephone Encounter (Signed)
PA faxed to [facility]- on 02/05  Thanks,  -Joseline

## 2017-07-10 ENCOUNTER — Encounter: Payer: Self-pay | Admitting: Family Medicine

## 2017-07-10 ENCOUNTER — Ambulatory Visit (INDEPENDENT_AMBULATORY_CARE_PROVIDER_SITE_OTHER): Payer: Medicare Other | Admitting: Family Medicine

## 2017-07-10 VITALS — BP 142/84 | HR 58 | Temp 97.8°F | Resp 16 | Ht 62.0 in | Wt 107.0 lb

## 2017-07-10 DIAGNOSIS — Z1231 Encounter for screening mammogram for malignant neoplasm of breast: Secondary | ICD-10-CM | POA: Diagnosis not present

## 2017-07-10 DIAGNOSIS — Z780 Asymptomatic menopausal state: Secondary | ICD-10-CM | POA: Diagnosis not present

## 2017-07-10 DIAGNOSIS — Z23 Encounter for immunization: Secondary | ICD-10-CM

## 2017-07-10 DIAGNOSIS — Z0000 Encounter for general adult medical examination without abnormal findings: Secondary | ICD-10-CM | POA: Diagnosis not present

## 2017-07-10 NOTE — Progress Notes (Deleted)
Patient: Robin Stephens, Female    DOB: 21-Oct-1950, 67 y.o.   MRN: [id number] Visit Date: 07/10/2017  Today's Provider: Shirlee Latch, MD   I, Joslyn Hy, CMA, am acting as scribe for Shirlee Latch, MD.  No chief complaint on file.  Subjective:    Annual physical exam Robin Stephens is a 67 y.o. female who presents today for health maintenance and complete physical. She feels {DESC; WELL/FAIRLY WELL/POORLY:18703}. She reports exercising ***. She reports she is sleeping {DESC; WELL/FAIRLY WELL/POORLY:18703}.  Last colonoscopy 09/25/2014- Dr. Mechele Collin. Hyperplastic polyp. Internal hemorrhoids. Diverticulosis. Last pap- NIL; HPV negative. Last mammogram- 07/29/2016- BI-RADS 1. -----------------------------------------------------------------   Review of Systems  Social History      She  reports that  has never smoked. she has never used smokeless tobacco. She reports that she drinks alcohol. She reports that she does not use drugs.       Social History   Socioeconomic History  . Marital status: Married    Spouse name: Not on file  . Number of children: Not on file  . Years of education: 33  . Highest education level: Not on file  Social Needs  . Financial resource strain: Not on file  . Food insecurity - worry: Not on file  . Food insecurity - inability: Not on file  . Transportation needs - medical: Not on file  . Transportation needs - non-medical: Not on file  Occupational History  . Occupation: retired    Associate Professor: Community education officer [employer]  Tobacco Use  . Smoking status: Never Smoker  . Smokeless tobacco: Never Used  Substance and Sexual Activity  . Alcohol use: Yes    Alcohol/week: 0.0 oz  . Drug use: No  . Sexual activity: Not on file  Other Topics Concern  . Not on file  Social History Narrative  . Not on file    No past medical history on file.   Patient Active Problem List   Diagnosis Date Noted  . Anxiety 01/21/2015  . DD  (diverticular disease) 01/21/2015  . Hypercholesteremia 01/21/2015  . Insomnia 01/21/2015    Past Surgical History:  Procedure Laterality Date  . APPENDECTOMY  2006    Family History        Family Status  Relation Name Status  . Father  Deceased  . Sister  Alive  . Mother  Deceased  . Sister  Deceased        Her family history includes Breast cancer (age of onset: 52) in her mother; Breast cancer (age of onset: 48) in her sister; Cancer in her sister; Heart disease in her father; Obesity in her sister.      Allergies  Allergen Reactions  . Sulfa Antibiotics Rash     Current Outpatient Medications:  .  ASHWAGANDHA PO, Take 460 mg by mouth., Disp: , Rfl:  .  calcium carbonate (OS-CAL) 600 MG TABS tablet, Take by mouth., Disp: , Rfl:  .  Coenzyme Q10 (CO Q10) 100 MG CAPS, Take by mouth., Disp: , Rfl:  .  Garlic 1200 MG CAPS, Take by mouth., Disp: , Rfl:  .  Ginger, Zingiber officinalis, 550 MG CAPS, Take by mouth., Disp: , Rfl:  .  Lactobacillus (ACIDOPHILUS) 100 MG CAPS, Take by mouth., Disp: , Rfl:  .  MULTIPLE VITAMINS PO, Take by mouth., Disp: , Rfl:  .  Omega-3 Fatty Acids (FISH OIL) 1200 MG CAPS, Take by mouth., Disp: , Rfl:  .  vitamin C (  ASCORBIC ACID) 500 MG tablet, Take by mouth., Disp: , Rfl:  .  zolpidem (AMBIEN) 10 MG tablet, take 1/2 tablet by mouth at bedtime, Disp: 15 tablet, Rfl: 5   Patient Care Team: Reine JustBurnette, Jennifer M, PA-C as PCP - General (Family Medicine)      Objective:   Vitals: There were no vitals taken for this visit.  There were no vitals filed for this visit.   Physical Exam   Depression Screen PHQ 2/9 Scores 06/28/2016  PHQ - 2 Score 0      Assessment & Plan:     Routine Health Maintenance and Physical Exam  Exercise Activities and Dietary recommendations Goals    None      Immunization History  Administered Date(s) Administered  . Td 10/17/2003  . Tdap 01/01/2013    Health Maintenance  Topic Date Due  . HIV  Screening  06/10/1967  . INFLUENZA VACCINE  12/28/2016  . DEXA SCAN  06/09/2017  . PNA vac Low Risk Adult (1 of 2 - PCV13) 06/09/2017  . MAMMOGRAM  07/30/2018  . PAP SMEAR  06/28/2019  . TETANUS/TDAP  01/02/2023  . COLONOSCOPY  09/24/2024  . Hepatitis C Screening  Completed     Discussed health benefits of physical activity, and encouraged her to engage in regular exercise appropriate for her age and condition.    --------------------------------------------------------------------

## 2017-07-10 NOTE — Patient Instructions (Signed)
Preventive Care 65 Years and Older, Female Preventive care refers to lifestyle choices and visits with your health care provider that can promote health and wellness. What does preventive care include?  A yearly physical exam. This is also called an annual well check.  Dental exams once or twice a year.  Routine eye exams. Ask your health care provider how often you should have your eyes checked.  Personal lifestyle choices, including: ? Daily care of your teeth and gums. ? Regular physical activity. ? Eating a healthy diet. ? Avoiding tobacco and drug use. ? Limiting alcohol use. ? Practicing safe sex. ? Taking low-dose aspirin every day. ? Taking vitamin and mineral supplements as recommended by your health care provider. What happens during an annual well check? The services and screenings done by your health care provider during your annual well check will depend on your age, overall health, lifestyle risk factors, and family history of disease. Counseling Your health care provider may ask you questions about your:  Alcohol use.  Tobacco use.  Drug use.  Emotional well-being.  Home and relationship well-being.  Sexual activity.  Eating habits.  History of falls.  Memory and ability to understand (cognition).  Work and work environment.  Reproductive health.  Screening You may have the following tests or measurements:  Height, weight, and BMI.  Blood pressure.  Lipid and cholesterol levels. These may be checked every 5 years, or more frequently if you are over 50 years old.  Skin check.  Lung cancer screening. You may have this screening every year starting at age 55 if you have a 30-pack-year history of smoking and currently smoke or have quit within the past 15 years.  Fecal occult blood test (FOBT) of the stool. You may have this test every year starting at age 50.  Flexible sigmoidoscopy or colonoscopy. You may have a sigmoidoscopy every 5 years or  a colonoscopy every 10 years starting at age 50.  Hepatitis C blood test.  Hepatitis B blood test.  Sexually transmitted disease (STD) testing.  Diabetes screening. This is done by checking your blood sugar (glucose) after you have not eaten for a while (fasting). You may have this done every 1-3 years.  Bone density scan. This is done to screen for osteoporosis. You may have this done starting at age 65.  Mammogram. This may be done every 1-2 years. Talk to your health care provider about how often you should have regular mammograms.  Talk with your health care provider about your test results, treatment options, and if necessary, the need for more tests. Vaccines Your health care provider may recommend certain vaccines, such as:  Influenza vaccine. This is recommended every year.  Tetanus, diphtheria, and acellular pertussis (Tdap, Td) vaccine. You may need a Td booster every 10 years.  Varicella vaccine. You may need this if you have not been vaccinated.  Zoster vaccine. You may need this after age 60.  Measles, mumps, and rubella (MMR) vaccine. You may need at least one dose of MMR if you were born in 1957 or later. You may also need a second dose.  Pneumococcal 13-valent conjugate (PCV13) vaccine. One dose is recommended after age 65.  Pneumococcal polysaccharide (PPSV23) vaccine. One dose is recommended after age 65.  Meningococcal vaccine. You may need this if you have certain conditions.  Hepatitis A vaccine. You may need this if you have certain conditions or if you travel or work in places where you may be exposed to hepatitis   A.  Hepatitis B vaccine. You may need this if you have certain conditions or if you travel or work in places where you may be exposed to hepatitis B.  Haemophilus influenzae type b (Hib) vaccine. You may need this if you have certain conditions.  Talk to your health care provider about which screenings and vaccines you need and how often you  need them. This information is not intended to replace advice given to you by your health care provider. Make sure you discuss any questions you have with your health care provider. Document Released: 06/12/2015 Document Revised: 02/03/2016 Document Reviewed: 03/17/2015 Elsevier Interactive Patient Education  2018 Elsevier Inc.  

## 2017-07-10 NOTE — Progress Notes (Signed)
Patient: Robin Stephens, Female    DOB: 1951/05/14, 89 y.o.   MRN: [id number] Visit Date: 07/10/2017  Today's Provider: Shirlee LatchAngela Caroleen Stoermer, MD   I, Joslyn HyEmily Ratchford, CMA, am acting as scribe for Shirlee LatchAngela Brentt Fread, MD.  Chief Complaint  Patient presents with  . Medicare Wellness   Subjective:    Initial Annual Wellness Visit Robin PatrickLuanne F Stephens is a 67 y.o. female. She feels well. She reports exercising 5 days per week for 30 minutes on the elliptical. She reports she is sleeping fairly well.  Last colonoscopy 09/25/2014- Dr. Mechele CollinElliott. Hyperplastic polyp. Internal hemorrhoids. Diverticulosis. Last pap- NIL; HPV negative. Has had adequate  Last mammogram- 07/29/2016- BI-RADS 1.  DEXA: Diagnosed with Osteopenia in 2006.  States she was treated with something othre than calcium but cannot remember what.  She has had no fractures.  She exercises regularly -----------------------------------------------------------   Review of Systems  Constitutional: Negative.   HENT: Negative.   Eyes: Negative.   Respiratory: Negative.   Cardiovascular: Negative.   Gastrointestinal: Negative.   Endocrine: Negative.   Genitourinary: Negative.   Musculoskeletal: Negative.   Skin: Negative.   Allergic/Immunologic: Negative.   Neurological: Negative.   Hematological: Negative.   Psychiatric/Behavioral: Negative.     Social History   Socioeconomic History  . Marital status: Married    Spouse name: Clide CliffRicky  . Number of children: 2  . Years of education: 4915  . Highest education level: Some college, no degree  Social Needs  . Financial resource strain: Not hard at all  . Food insecurity - worry: Never true  . Food insecurity - inability: Never true  . Transportation needs - medical: No  . Transportation needs - non-medical: No  Occupational History  . Occupation: retired  Tobacco Use  . Smoking status: Never Smoker  . Smokeless tobacco: Never Used  Substance and Sexual Activity  .  Alcohol use: Yes    Comment: wine occasionally  . Drug use: No  . Sexual activity: Not on file  Other Topics Concern  . Not on file  Social History Narrative  . Not on file    History reviewed. No pertinent past medical history.   Patient Active Problem List   Diagnosis Date Noted  . Anxiety 01/21/2015  . 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 Hypercholesteremia 01/21/2015  . Insomnia 01/21/2015    Past Surgical History:  Procedure Laterality Date  . APPENDECTOMY  2006  . EYE SURGERY     PRK    Her family history includes Breast cancer (age of onset: 2645) in her mother; Breast cancer (age of onset: 454) in her sister; Cancer in her sister; Healthy in her sister; Heart disease in her father; Obesity in her sister.      Current Outpatient Medications:  .  ASHWAGANDHA PO, Take 460 mg by mouth. , Disp: , Rfl:  .  calcium carbonate (OS-CAL) 600 MG TABS tablet, Take by mouth., Disp: , Rfl:  .  Garlic 1000 MG CAPS, Take by mouth., Disp: , Rfl:  .  Ginger, Zingiber officinalis, 550 MG CAPS, Take by mouth., Disp: , Rfl:  .  Lactobacillus (ACIDOPHILUS) 100 MG CAPS, Take by mouth., Disp: , Rfl:  .  MULTIPLE VITAMINS PO, Take by mouth., Disp: , Rfl:  .  Omega-3 Fatty Acids (FISH OIL) 1200 MG CAPS, Take by mouth., Disp: , Rfl:  .  vitamin C (ASCORBIC ACID) 500 MG tablet, Take by mouth., Disp: , Rfl:  .  zolpidem (  AMBIEN) 10 MG tablet, take 1/2 tablet by mouth at bedtime, Disp: 15 tablet, Rfl: 5  Patient Care Team: Margaretann Loveless, PA-C as PCP - General (Family Medicine)     Objective:   Vitals: BP (!) 158/88 (BP Location: Left Arm, Patient Position: Sitting, Cuff Size: Normal)   Pulse (!) 58   Temp 97.8 F (36.6 C) (Oral)   Resp 16   Ht 5\' 2"  (1.575 m)   Wt 107 lb (48.5 kg)   SpO2 99%   BMI 19.57 kg/m   Physical Exam  Constitutional: She is oriented to person, place, and time. She appears well-developed and well-nourished. No distress.  HENT:  Head: Normocephalic  and atraumatic.  Right Ear: External ear normal.  Left Ear: External ear normal.  Nose: Nose normal.  Mouth/Throat: Oropharynx is clear and moist.  Eyes: Conjunctivae and EOM are normal. Pupils are equal, round, and reactive to light. No scleral icterus.  Neck: Neck supple. No thyromegaly present.  Cardiovascular: Normal rate, regular rhythm, normal heart sounds and intact distal pulses.  No murmur heard. Pulmonary/Chest: Breath sounds normal. No respiratory distress. She has no wheezes. She has no rales.  Abdominal: Soft. Bowel sounds are normal. She exhibits no distension. There is no tenderness. There is no rebound and no guarding.  Genitourinary:  Genitourinary Comments: Breasts: breasts appear normal, no suspicious masses, no skin or nipple changes or axillary nodes.  Musculoskeletal: She exhibits no edema or deformity.  Lymphadenopathy:    She has no cervical adenopathy.  Neurological: She is alert and oriented to person, place, and time.  Skin: Skin is warm and dry. No rash noted.  Psychiatric: She has a normal mood and affect. Her behavior is normal.  Vitals reviewed.   Activities of Daily Living In your present state of health, do you have any difficulty performing the following activities: 07/10/2017  Hearing? N  Vision? Y  Difficulty concentrating or making decisions? N  Walking or climbing stairs? N  Dressing or bathing? N  Doing errands, shopping? N  Some recent data might be hidden    Fall Risk Assessment Fall Risk  07/10/2017  Falls in the past year? No     Depression Screen PHQ 2/9 Scores 07/10/2017 06/28/2016  PHQ - 2 Score 0 0    Cognitive Testing - 6-CIT  Correct? Score   What year is it? yes 0 0 or 4  What month is it? yes 0 0 or 3  Memorize:    [place], Risk,  44,  [address]  [place],      What time is it? (within 1 hour) yes 0 0 or 3  Count backwards from 20 yes 0 0, 2, or 4  Name the months of the year yes 0 0, 2, or 4  Repeat name & address above  yes 2 0, 2, 4, 6, 8, or 10       TOTAL SCORE  2/28   Interpretation:  Normal  Normal (0-7) Abnormal (8-28)   Audit-C Alcohol Use Screening  Question Answer Points  How often do you have alcoholic drink? weekly 2  On days you do drink alcohol, how many drinks do you typically consume? 1-2 0  How oftey will you drink 6 or more in a total? never 0  Total Score:  2   A score of 3 or more in women, and 4 or more in men indicates increased risk for alcohol abuse, EXCEPT if all of the points are from question  1.    Assessment & Plan:     Annual Wellness Visit  Reviewed patient's Family Medical History Reviewed and updated list of patient's medical providers Assessment of cognitive impairment was done Assessed patient's functional ability Established a written schedule for health screening services Health Risk Assessent Completed and Reviewed  Exercise Activities and Dietary recommendations Goals    None      Immunization History  Administered Date(s) Administered  . Td 10/17/2003  . Tdap 01/01/2013    Health Maintenance  Topic Date Due  . HIV Screening  06/10/1967  . INFLUENZA VACCINE  12/28/2016  . DEXA SCAN  06/09/2017  . PNA vac Low Risk Adult (1 of 2 - PCV13) 06/09/2017  . MAMMOGRAM  07/30/2018  . PAP SMEAR  06/28/2019  . TETANUS/TDAP  01/02/2023  . COLONOSCOPY  09/24/2024  . Hepatitis C Screening  Completed     Discussed health benefits of physical activity, and encouraged her to engage in regular exercise appropriate for her age and condition.    ------------------------------------------------------------------------------------------------------------ Problem List Items Addressed This Visit    None    Visit Diagnoses    Welcome to Medicare preventive visit    -  Primary   Relevant Orders   EKG 12-Lead (Completed)   Breast cancer screening by mammogram       Relevant Orders   MM SCREENING BREAST TOMO BILATERAL   Postmenopausal       Relevant Orders    DG Bone Density   Need for vaccination against Streptococcus pneumoniae using pneumococcal conjugate vaccine 13       Relevant Orders   Pneumococcal conjugate vaccine 13-valent IM      Will f/u later this week about other non-wellness concerns Plan for labwork at next visit  The entirety of the information documented in the History of Present Illness, Review of Systems and Physical Exam were personally obtained by me. Portions of this information were initially documented by Irving Burton Ratchford, CMA and reviewed by me for thoroughness and accuracy.    Erasmo Downer, MD, MPH [facility] 07/10/2017 11:54 AM

## 2017-07-13 ENCOUNTER — Ambulatory Visit (INDEPENDENT_AMBULATORY_CARE_PROVIDER_SITE_OTHER): Payer: Medicare Other | Admitting: Family Medicine

## 2017-07-13 ENCOUNTER — Other Ambulatory Visit: Payer: Self-pay | Admitting: Physician Assistant

## 2017-07-13 ENCOUNTER — Encounter: Payer: Self-pay | Admitting: Family Medicine

## 2017-07-13 VITALS — BP 126/82 | HR 62 | Temp 98.1°F | Resp 16

## 2017-07-13 DIAGNOSIS — Z130 Encounter for screening for diseases of the blood and blood-forming organs and certain disorders involving the immune mechanism: Secondary | ICD-10-CM | POA: Diagnosis not present

## 2017-07-13 DIAGNOSIS — R21 Rash and other nonspecific skin eruption: Secondary | ICD-10-CM | POA: Diagnosis not present

## 2017-07-13 DIAGNOSIS — E7800 Pure hypercholesterolemia, unspecified: Secondary | ICD-10-CM | POA: Diagnosis not present

## 2017-07-13 DIAGNOSIS — R739 Hyperglycemia, unspecified: Secondary | ICD-10-CM

## 2017-07-13 DIAGNOSIS — G4700 Insomnia, unspecified: Secondary | ICD-10-CM | POA: Diagnosis not present

## 2017-07-13 MED ORDER — SUVOREXANT 10 MG PO TABS: 10.0000 mg | ORAL_TABLET | Freq: Every evening | ORAL | 5 refills | Status: DC | PRN

## 2017-07-13 NOTE — Progress Notes (Signed)
Patient: Robin Stephens Female    DOB: Jul 24, 1950   67 y.o.   MRN: [id number] Visit Date: 07/13/2017  Today's Provider: Shirlee Latch, MD   I, Joslyn Hy, CMA, am acting as scribe for Shirlee Latch, MD.  Chief Complaint  Patient presents with  . Insomnia   Subjective:    Insomnia  Primary symptoms: sleep disturbance.  The problem is unchanged (stable on Ambien). Types of beverages you drink: coffee (no caffiene past 2:00 pm; does have a mocha in the mornings.). Treatments tried: Ambien; she has also tried avoiding phone calls, "blue light glasses while watching TV after 9:30", reading before bed, supplements that can be "sleep effective". She has also tried ideas she saw on Dr. Neil Crouch. The treatment provided moderate ([employer] does not cover Ambien, but will cover Belsomra, which pt is interested in trying) relief. Typical bedtime:  10-11 P.M. (reads from 10:00-10:30 pm).  How long after going to bed to you fall asleep: over an hour.   Sleep duration: no napping.  PMH includes: no restless leg syndrome (undiagnosed, but pt states "sometimes I do have legs", about 1-2 times per month).  Tried Hemp extract that she had seen on TV and it gave headaches, abnormal dreams   Hives Pt reports she noticed a rash on her face that she self diagnosed as hives. The rash has been occurring intermittently for about 1 month. She has tried Benadryl, with relief.     Allergies  Allergen Reactions  . Sulfa Antibiotics Rash     Current Outpatient Medications:  .  ASHWAGANDHA PO, Take 460 mg by mouth. , Disp: , Rfl:  .  calcium carbonate (OS-CAL) 600 MG TABS tablet, Take by mouth., Disp: , Rfl:  .  Garlic 1000 MG CAPS, Take by mouth., Disp: , Rfl:  .  Ginger, Zingiber officinalis, 550 MG CAPS, Take by mouth., Disp: , Rfl:  .  Lactobacillus (ACIDOPHILUS) 100 MG CAPS, Take by mouth., Disp: , Rfl:  .  MULTIPLE VITAMINS PO, Take by mouth., Disp: , Rfl:  .  Omega-3 Fatty Acids (FISH OIL)  1200 MG CAPS, Take by mouth., Disp: , Rfl:  .  vitamin C (ASCORBIC ACID) 500 MG tablet, Take by mouth., Disp: , Rfl:  .  Suvorexant (BELSOMRA) 10 MG TABS, Take 10 mg by mouth at bedtime as needed., Disp: 30 tablet, Rfl: 5  Review of Systems  Constitutional: Negative for activity change, appetite change, chills, diaphoresis, fatigue, fever and unexpected weight change.  Skin: Positive for rash.  Psychiatric/Behavioral: Positive for sleep disturbance. The patient has insomnia.     Social History   Tobacco Use  . Smoking status: Never Smoker  . Smokeless tobacco: Never Used  Substance Use Topics  . Alcohol use: Yes    Comment: wine occasionally   Objective:   BP 126/82 (BP Location: Left Arm, Patient Position: Sitting, Cuff Size: Normal)   Pulse 62   Temp 98.1 F (36.7 C) (Oral)   Resp 16   SpO2 94%  Vitals:   07/13/17 1333  BP: 126/82  Pulse: 62  Resp: 16  Temp: 98.1 F (36.7 C)  TempSrc: Oral  SpO2: 94%     Physical Exam  Constitutional: She is oriented to person, place, and time. She appears well-developed and well-nourished. No distress.  HENT:  Head: Normocephalic and atraumatic.  Eyes: Conjunctivae are normal. No scleral icterus.  Cardiovascular: Normal rate, regular rhythm, normal heart sounds and intact distal pulses.  No  murmur heard. Pulmonary/Chest: Effort normal and breath sounds normal. No respiratory distress. She has no wheezes. She has no rales.  Musculoskeletal: She exhibits no edema or deformity.  Neurological: She is alert and oriented to person, place, and time.  Skin: Skin is warm and dry. No rash noted.  Psychiatric: She has a normal mood and affect. Her behavior is normal.  Vitals reviewed.       Assessment & Plan:      Problem List Items Addressed This Visit      Musculoskeletal and Integument   Rash and nonspecific skin eruption    Possible hives Possible contact dermatitis from facial creams Not present currently Can try Zyrtec  and Zantac for antihistamine effects Would do elimination "diet" of facial products to determine if one is the culprit Return precautions discussed        Other   Hypercholesteremia    Screening labs ordered today      Relevant Orders   Lipid panel   Comprehensive metabolic panel   Insomnia - Primary    Uncontrolled insurance denied Ambien which she had previously been taking consistently Discussed sleep hygiene and she is doing this already We will try Belsomra instead F/u in 6 months       Other Visit Diagnoses    Hyperglycemia       Relevant Orders   Hemoglobin A1c   Screening for blood disease       Relevant Orders   CBC w/Diff/Platelet       Return in about 6 months (around 01/10/2018) for insomnia f/u.   The entirety of the information documented in the History of Present Illness, Review of Systems and Physical Exam were personally obtained by me. Portions of this information were initially documented by Irving BurtonEmily Ratchford, CMA and reviewed by me for thoroughness and accuracy.    Erasmo DownerBacigalupo, Ashana Tullo M, MD, MPH [facility] 07/13/2017 2:27 PM

## 2017-07-13 NOTE — Patient Instructions (Addendum)
Try Zyrtec (10mg  daily) and Zantac (150mg  twice daily) for hives - take regularly for 1 week and then as needed  Slowly add back facial creams, etc - one at a time Can try Neutrogena Hydrogel (Hyaluronic acid)  Insomnia Insomnia is a sleep disorder that makes it difficult to fall asleep or to stay asleep. Insomnia can cause tiredness (fatigue), low energy, difficulty concentrating, mood swings, and poor performance at work or school. There are three different ways to classify insomnia:  Difficulty falling asleep.  Difficulty staying asleep.  Waking up too early in the morning.  Any type of insomnia can be long-term (chronic) or short-term (acute). Both are common. Short-term insomnia usually lasts for three months or less. Chronic insomnia occurs at least three times a week for longer than three months. What are the causes? Insomnia may be caused by another condition, situation, or substance, such as:  Anxiety.  Certain medicines.  Gastroesophageal reflux disease (GERD) or other gastrointestinal conditions.  Asthma or other breathing conditions.  Restless legs syndrome, sleep apnea, or other sleep disorders.  Chronic pain.  Menopause. This may include hot flashes.  Stroke.  Abuse of alcohol, tobacco, or illegal drugs.  Depression.  Caffeine.  Neurological disorders, such as Alzheimer disease.  An overactive thyroid (hyperthyroidism).  The cause of insomnia may not be known. What increases the risk? Risk factors for insomnia include:  Gender. Women are more commonly affected than men.  Age. Insomnia is more common as you get older.  Stress. This may involve your professional or personal life.  Income. Insomnia is more common in people with lower income.  Lack of exercise.  Irregular work schedule or night shifts.  Traveling between different time zones.  What are the signs or symptoms? If you have insomnia, trouble falling asleep or trouble staying  asleep is the main symptom. This may lead to other symptoms, such as:  Feeling fatigued.  Feeling nervous about going to sleep.  Not feeling rested in the morning.  Having trouble concentrating.  Feeling irritable, anxious, or depressed.  How is this treated? Treatment for insomnia depends on the cause. If your insomnia is caused by an underlying condition, treatment will focus on addressing the condition. Treatment may also include:  Medicines to help you sleep.  Counseling or therapy.  Lifestyle adjustments.  Follow these instructions at home:  Take medicines only as directed by your health care provider.  Keep regular sleeping and waking hours. Avoid naps.  Keep a sleep diary to help you and your health care provider figure out what could be causing your insomnia. Include: ? When you sleep. ? When you wake up during the night. ? How well you sleep. ? How rested you feel the next day. ? Any side effects of medicines you are taking. ? What you eat and drink.  Make your bedroom a comfortable place where it is easy to fall asleep: ? Put up shades or special blackout curtains to block light from outside. ? Use a white noise machine to block noise. ? Keep the temperature cool.  Exercise regularly as directed by your health care provider. Avoid exercising right before bedtime.  Use relaxation techniques to manage stress. Ask your health care provider to suggest some techniques that may work well for you. These may include: ? Breathing exercises. ? Routines to release muscle tension. ? Visualizing peaceful scenes.  Cut back on alcohol, caffeinated beverages, and cigarettes, especially close to bedtime. These can disrupt your sleep.  Do not  overeat or eat spicy foods right before bedtime. This can lead to digestive discomfort that can make it hard for you to sleep.  Limit screen use before bedtime. This includes: ? Watching TV. ? Using your smartphone, tablet, and  computer.  Stick to a routine. This can help you fall asleep faster. Try to do a quiet activity, brush your teeth, and go to bed at the same time each night.  Get out of bed if you are still awake after 15 minutes of trying to sleep. Keep the lights down, but try reading or doing a quiet activity. When you feel sleepy, go back to bed.  Make sure that you drive carefully. Avoid driving if you feel very sleepy.  Keep all follow-up appointments as directed by your health care provider. This is important. Contact a health care provider if:  You are tired throughout the day or have trouble in your daily routine due to sleepiness.  You continue to have sleep problems or your sleep problems get worse. Get help right away if:  You have serious thoughts about hurting yourself or someone else. This information is not intended to replace advice given to you by your health care provider. Make sure you discuss any questions you have with your health care provider. Document Released: 05/13/2000 Document Revised: 10/16/2015 Document Reviewed: 02/14/2014 Elsevier Interactive Patient Education  [employer].

## 2017-07-13 NOTE — Assessment & Plan Note (Signed)
Screening labs ordered today 

## 2017-07-13 NOTE — Assessment & Plan Note (Addendum)
Uncontrolled insurance denied Ambien which she had previously been taking consistently Discussed sleep hygiene and she is doing this already We will try Belsomra instead F/u in 6 months

## 2017-07-13 NOTE — Assessment & Plan Note (Signed)
Possible hives Possible contact dermatitis from facial creams Not present currently Can try Zyrtec and Zantac for antihistamine effects Would do elimination "diet" of facial products to determine if one is the culprit Return precautions discussed

## 2017-07-17 ENCOUNTER — Telehealth: Payer: Self-pay | Admitting: Family Medicine

## 2017-07-17 NOTE — Telephone Encounter (Signed)
Please review

## 2017-07-17 NOTE — Telephone Encounter (Signed)
Pt is requesting a call back to discuss her no longer taking zolpidem (AMBIEN) 10 MG tablet due to insurance. Pt stated that her medication was changed to Suvorexant (BELSOMRA) 10 MG TABS. Pt stated that she hasn't tried the new medication but she would like to discuss instead of taking a sleep aid medication she wanted to discuss possibly trying anxiety medication because she thinks the reason she has trouble sleeping might be related and the anxiety medication may help. Please advise. Thanks TNP

## 2017-07-18 ENCOUNTER — Encounter: Payer: Self-pay | Admitting: Family Medicine

## 2017-07-18 ENCOUNTER — Other Ambulatory Visit: Payer: Self-pay | Admitting: Family Medicine

## 2017-07-18 MED ORDER — ESCITALOPRAM OXALATE 5 MG PO TABS: 5.0000 mg | ORAL_TABLET | Freq: Every day | ORAL | 2 refills | Status: DC

## 2017-07-18 NOTE — Telephone Encounter (Signed)
Replied to patient MyChart message.  Erasmo DownerBacigalupo, Adit Riddles M, MD, MPH [facility] 07/18/2017 8:12 AM

## 2017-07-22 LAB — CBC WITH DIFFERENTIAL/PLATELET
BASOS: 1 %
Basophils Absolute: 0 10*3/uL (ref 0.0–0.2)
EOS (ABSOLUTE): 0.1 10*3/uL (ref 0.0–0.4)
Eos: 3 %
HEMOGLOBIN: 14 g/dL (ref 11.1–15.9)
Hematocrit: 42.6 % (ref 34.0–46.6)
IMMATURE GRANS (ABS): 0 10*3/uL (ref 0.0–0.1)
IMMATURE GRANULOCYTES: 0 %
LYMPHS: 37 %
Lymphocytes Absolute: 1.6 10*3/uL (ref 0.7–3.1)
MCH: 31.4 pg (ref 26.6–33.0)
MCHC: 32.9 g/dL (ref 31.5–35.7)
MCV: 96 fL (ref 79–97)
Monocytes Absolute: 0.5 10*3/uL (ref 0.1–0.9)
Monocytes: 12 %
NEUTROS PCT: 47 %
Neutrophils Absolute: 2.1 10*3/uL (ref 1.4–7.0)
PLATELETS: 237 10*3/uL (ref 150–379)
RBC: 4.46 x10E6/uL (ref 3.77–5.28)
RDW: 12.8 % (ref 12.3–15.4)
WBC: 4.4 10*3/uL (ref 3.4–10.8)

## 2017-07-22 LAB — COMPREHENSIVE METABOLIC PANEL
A/G RATIO: 1.9 (ref 1.2–2.2)
ALT: 21 IU/L (ref 0–32)
AST: 17 IU/L (ref 0–40)
Albumin: 4.2 g/dL (ref 3.6–4.8)
Alkaline Phosphatase: 52 IU/L (ref 39–117)
BUN/Creatinine Ratio: 21 (ref 12–28)
BUN: 15 mg/dL (ref 8–27)
Bilirubin Total: 0.5 mg/dL (ref 0.0–1.2)
CALCIUM: 9.1 mg/dL (ref 8.7–10.3)
CO2: 25 mmol/L (ref 20–29)
Chloride: 103 mmol/L (ref 96–106)
Creatinine, Ser: 0.71 mg/dL (ref 0.57–1.00)
GFR calc Af Amer: 103 mL/min/{1.73_m2} (ref >59)
GFR, EST NON AFRICAN AMERICAN: 90 mL/min/{1.73_m2} (ref >59)
GLUCOSE: 86 mg/dL (ref 65–99)
Globulin, Total: 2.2 g/dL (ref 1.5–4.5)
POTASSIUM: 4.4 mmol/L (ref 3.5–5.2)
Sodium: 142 mmol/L (ref 134–144)
TOTAL PROTEIN: 6.4 g/dL (ref 6.0–8.5)

## 2017-07-22 LAB — LIPID PANEL
CHOL/HDL RATIO: 3.1 ratio (ref 0.0–4.4)
Cholesterol, Total: 253 mg/dL — ABNORMAL HIGH (ref 100–199)
HDL: 82 mg/dL (ref >39)
LDL CALC: 154 mg/dL — AB (ref 0–99)
Triglycerides: 84 mg/dL (ref 0–149)
VLDL Cholesterol Cal: 17 mg/dL (ref 5–40)

## 2017-07-22 LAB — HEMOGLOBIN A1C
Est. average glucose Bld gHb Est-mCnc: 103 mg/dL
HEMOGLOBIN A1C: 5.2 % (ref 4.8–5.6)

## 2017-07-24 ENCOUNTER — Telehealth: Payer: Self-pay

## 2017-07-24 NOTE — Telephone Encounter (Signed)
-----   Message from Erasmo DownerAngela M Bacigalupo, MD sent at 07/24/2017  9:35 AM EST ----- Normal Blood counts, kidney function, liver function, electrolytes.  No diabetes.  Cholesterol is high.  10 year risk of heart disease/stroke is low at 4.7%. Would not recommend medications at this time  Erasmo DownerBacigalupo, Angela M, MD, MPH [facility] 07/24/2017 9:35 AM

## 2017-07-24 NOTE — Telephone Encounter (Signed)
Pt advised and agrees with tx plan. 

## 2017-08-08 ENCOUNTER — Ambulatory Visit
Admission: RE | Admit: 2017-08-08 | Discharge: 2017-08-08 | Disposition: A | Discharge status: Home / Self Care | Payer: Medicare Other | Source: Ambulatory Visit | Attending: Family Medicine | Admitting: Family Medicine

## 2017-08-08 DIAGNOSIS — M85851 Other specified disorders of bone density and structure, right thigh: Secondary | ICD-10-CM | POA: Insufficient documentation

## 2017-08-08 DIAGNOSIS — M8588 Other specified disorders of bone density and structure, other site: Secondary | ICD-10-CM | POA: Diagnosis not present

## 2017-08-08 DIAGNOSIS — Z780 Asymptomatic menopausal state: Secondary | ICD-10-CM

## 2017-08-08 DIAGNOSIS — Z1231 Encounter for screening mammogram for malignant neoplasm of breast: Secondary | ICD-10-CM | POA: Insufficient documentation

## 2017-08-09 ENCOUNTER — Telehealth: Payer: Self-pay

## 2017-08-09 NOTE — Telephone Encounter (Signed)
[id number] 

## 2017-08-09 NOTE — Telephone Encounter (Signed)
Advised patient as below.  

## 2017-08-09 NOTE — Telephone Encounter (Signed)
-----   Message from Erasmo DownerAngela M Bacigalupo, MD sent at 08/09/2017  8:45 AM EDT ----- Normal mammogram. See other result note also.  Erasmo DownerBacigalupo, Angela M, MD, MPH [facility] 08/09/2017 8:45 AM

## 2017-08-09 NOTE — Telephone Encounter (Signed)
-----   Message from Erasmo DownerAngela M Bacigalupo, MD sent at 08/09/2017  8:44 AM EDT ----- Bone density scan shows osteopenia (this is some bone loss, but not as bad as osteoporosis).  Recommend regular weight bearing exercise, avoiding smoking, and adequate Ca (1200mg /day) and Vit D (1000 units daily) via diet or supplement.  We will recheck in 2 years to ensure this hasn't worsened.  Erasmo DownerBacigalupo, Angela M, MD, MPH [facility] 08/09/2017 8:44 AM

## 2017-09-11 ENCOUNTER — Encounter: Payer: Self-pay | Admitting: Family Medicine

## 2017-09-12 ENCOUNTER — Other Ambulatory Visit: Payer: Self-pay

## 2017-09-12 MED ORDER — ESCITALOPRAM OXALATE 10 MG PO TABS: 10.0000 mg | ORAL_TABLET | Freq: Every day | ORAL | 3 refills | Status: DC

## 2017-10-12 ENCOUNTER — Other Ambulatory Visit: Payer: Self-pay | Admitting: Family Medicine

## 2017-12-19 ENCOUNTER — Other Ambulatory Visit: Payer: Self-pay | Admitting: Family Medicine

## 2018-01-10 ENCOUNTER — Ambulatory Visit: Payer: Medicare Other | Admitting: Family Medicine

## 2018-01-10 ENCOUNTER — Encounter: Payer: Self-pay | Admitting: Family Medicine

## 2018-01-10 VITALS — BP 114/78 | HR 66 | Temp 97.9°F | Wt 114.2 lb

## 2018-01-10 DIAGNOSIS — F419 Anxiety disorder, unspecified: Secondary | ICD-10-CM

## 2018-01-10 DIAGNOSIS — G4700 Insomnia, unspecified: Secondary | ICD-10-CM

## 2018-01-10 MED ORDER — ESCITALOPRAM OXALATE 10 MG PO TABS: 10.0000 mg | ORAL_TABLET | Freq: Every day | ORAL | 3 refills | Status: DC

## 2018-01-10 NOTE — Assessment & Plan Note (Signed)
Well controlled 2/2 anxiety Did not have improvement with Remus Lofflerambien (was on this for years until insurance no longer covered it) and Belsomra Now much improved on Lexapro Discussed sleep hygiene as well F/u in 6 months

## 2018-01-10 NOTE — Assessment & Plan Note (Signed)
Much improved after starting Lexapro Doing well on Lexapro 10mg  daily - will continue Discussed return precautions F/u in ~6 months

## 2018-01-10 NOTE — Progress Notes (Signed)
Patient: Robin Stephens Female    DOB: [birth year]   67 y.o.   MRN: [id number] Visit Date: 01/10/2018  Today's Provider: Shirlee LatchAngela Bacigalupo, MD   Chief Complaint  Patient presents with  . Insomnia    follow up    Subjective:    HPI Insomnia: Patient presents for a 6 month follow up. Last OV was on 07/13/17. Symptoms were uncontrolled. Patient started Belsomra 10 mg and advised to continue sleep hygiene. Patient states she had side effects from the medication. On 07/18/17 patient requested to switch to Lexapro 5 mg and titrated to 10 mg.  She reports good compliance with treatment plan. Symptoms are improved. Patient states she had great improvement with sleep and anxiety symptoms .     Allergies  Allergen Reactions  . Sulfa Antibiotics Rash     Current Outpatient Medications:  .  ASHWAGANDHA PO, Take 460 mg by mouth. , Disp: , Rfl:  .  calcium carbonate (OS-CAL) 600 MG TABS tablet, Take by mouth., Disp: , Rfl:  .  escitalopram (LEXAPRO) 10 MG tablet, TAKE 1 TABLET(10 MG) BY MOUTH DAILY, Disp: 30 tablet, Rfl: 5 .  Garlic 1000 MG CAPS, Take by mouth., Disp: , Rfl:  .  Ginger, Zingiber officinalis, 550 MG CAPS, Take by mouth., Disp: , Rfl:  .  Lactobacillus (ACIDOPHILUS) 100 MG CAPS, Take by mouth., Disp: , Rfl:  .  MULTIPLE VITAMINS PO, Take by mouth., Disp: , Rfl:  .  Omega-3 Fatty Acids (FISH OIL) 1200 MG CAPS, Take by mouth., Disp: , Rfl:  .  Suvorexant (BELSOMRA) 10 MG TABS, Take 10 mg by mouth at bedtime as needed., Disp: 30 tablet, Rfl: 5 .  vitamin C (ASCORBIC ACID) 500 MG tablet, Take by mouth., Disp: , Rfl:     Review of Systems  Constitutional: Negative.   Respiratory: Negative.   Cardiovascular: Negative.   Neurological: Negative.   Psychiatric/Behavioral: Sleep disturbance: improved. Nervous/anxious: improved.     Social History   Tobacco Use  . Smoking status: Never Smoker  . Smokeless tobacco: Never Used  Substance Use Topics  . Alcohol use: Yes   Comment: wine occasionally   Objective:   BP 114/78 (BP Location: Right Arm, Patient Position: Sitting, Cuff Size: Normal)   Pulse 66   Temp 97.9 F (36.6 C) (Oral)   Wt 114 lb 3.2 oz (51.8 kg)   SpO2 99%   BMI 20.89 kg/m    Physical Exam  Constitutional: She is oriented to person, place, and time. She appears well-developed and well-nourished. No distress.  HENT:  Head: Normocephalic and atraumatic.  Eyes: Conjunctivae are normal.  Neck: Neck supple. No thyromegaly present.  Cardiovascular: Normal rate, regular rhythm, normal heart sounds and intact distal pulses.  No murmur heard. Pulmonary/Chest: Effort normal and breath sounds normal. No respiratory distress. She has no wheezes. She has no rales.  Musculoskeletal: She exhibits no edema.  Lymphadenopathy:    She has no cervical adenopathy.  Neurological: She is alert and oriented to person, place, and time.  Skin: Skin is warm and dry. Capillary refill takes less than 2 seconds. No rash noted.  Psychiatric: She has a normal mood and affect. Her behavior is normal.  Vitals reviewed.       Assessment & Plan:   Problem List Items Addressed This Visit      Other   Anxiety    Much improved after starting Lexapro Doing well on Lexapro 10mg  daily - will continue Discussed  return precautions F/u in ~6 months      Relevant Medications   escitalopram (LEXAPRO) 10 MG tablet   Insomnia - Primary    Well controlled 2/2 anxiety Did not have improvement with Remus Lofflerambien (was on this for years until insurance no longer covered it) and Belsomra Now much improved on Lexapro Discussed sleep hygiene as well F/u in 6 months          Return in about 6 months (around 07/13/2018) for CPE/AWV (different days).   The entirety of the information documented in the History of Present Illness, Review of Systems and Physical Exam were personally obtained by me. Portions of this information were initially documented by Presley RaddleNikki Walston, CMA  and reviewed by me for thoroughness and accuracy.    Erasmo DownerBacigalupo, Angela M, MD, MPH [facility] 01/10/2018 2:05 PM

## 2018-02-14 ENCOUNTER — Encounter: Payer: Self-pay | Admitting: Family Medicine

## 2018-02-16 ENCOUNTER — Ambulatory Visit: Payer: Medicare Other | Admitting: Family Medicine

## 2018-02-16 ENCOUNTER — Encounter: Payer: Self-pay | Admitting: Family Medicine

## 2018-02-16 VITALS — BP 128/70 | HR 56 | Temp 98.3°F | Wt 115.0 lb

## 2018-02-16 DIAGNOSIS — B078 Other viral warts: Secondary | ICD-10-CM

## 2018-02-16 DIAGNOSIS — L259 Unspecified contact dermatitis, unspecified cause: Secondary | ICD-10-CM | POA: Diagnosis not present

## 2018-02-16 MED ORDER — TRIAMCINOLONE ACETONIDE 0.5 % EX OINT: 1.0000 "application " | TOPICAL_OINTMENT | Freq: Two times a day (BID) | CUTANEOUS | 1 refills | Status: DC

## 2018-02-16 NOTE — Patient Instructions (Signed)

## 2018-02-16 NOTE — Progress Notes (Signed)
Patient: Robin Stephens Female    DOB: 20-Nov-1950   67 y.o.   MRN: [id number] Visit Date: 02/16/2018  Today's Provider: Shirlee Latch, MD   Chief Complaint  Patient presents with  . Rash   Subjective:    I, Presley Raddle, CMA, am acting as a Neurosurgeon for Shirlee Latch, MD.   Rash  This is a new problem. Episode onset: approximately 2 months. The problem is unchanged. The affected locations include the back, left arm, right arm, right shoulder and left shoulder. The rash is characterized by itchiness. It is unknown if there was an exposure to a precipitant. Treatments tried: Careers adviser. The treatment provided mild relief. There is no history of allergies, asthma or eczema.       Allergies  Allergen Reactions  . Sulfa Antibiotics Rash     Current Outpatient Medications:  .  ASHWAGANDHA PO, Take 460 mg by mouth. , Disp: , Rfl:  .  calcium carbonate (OS-CAL) 600 MG TABS tablet, Take by mouth., Disp: , Rfl:  .  escitalopram (LEXAPRO) 10 MG tablet, Take 1 tablet (10 mg total) by mouth daily., Disp: 90 tablet, Rfl: 3 .  Ginger, Zingiber officinalis, 550 MG CAPS, Take by mouth., Disp: , Rfl:  .  Lactobacillus (ACIDOPHILUS) 100 MG CAPS, Take by mouth., Disp: , Rfl:  .  MULTIPLE VITAMINS PO, Take by mouth., Disp: , Rfl:  .  Omega-3 Fatty Acids (FISH OIL) 1200 MG CAPS, Take by mouth., Disp: , Rfl:  .  vitamin C (ASCORBIC ACID) 500 MG tablet, Take by mouth., Disp: , Rfl:  .  Garlic 1000 MG CAPS, Take by mouth., Disp: , Rfl:   Review of Systems  Constitutional: Negative.   Respiratory: Negative.   Cardiovascular: Negative.   Musculoskeletal: Negative.   Skin: Positive for rash.    Social History   Tobacco Use  . Smoking status: Never Smoker  . Smokeless tobacco: Never Used  Substance Use Topics  . Alcohol use: Yes    Comment: wine occasionally   Objective:   BP 128/70 (BP Location: Right Arm, Patient Position: Sitting, Cuff Size: Normal)   Pulse (!) 56   Temp 98.3  F (36.8 C) (Oral)   Wt 115 lb (52.2 kg)   SpO2 99%   BMI 21.03 kg/m  Vitals:   02/16/18 0936  BP: 128/70  Pulse: (!) 56  Temp: 98.3 F (36.8 C)  TempSrc: Oral  SpO2: 99%  Weight: 115 lb (52.2 kg)     Physical Exam  Constitutional: She is oriented to person, place, and time. She appears well-developed and well-nourished. No distress.  HENT:  Head: Normocephalic and atraumatic.  Eyes: Conjunctivae are normal. No scleral icterus.  Neck: Neck supple. No thyromegaly present.  Cardiovascular: Normal rate, regular rhythm, normal heart sounds and intact distal pulses.  No murmur heard. Pulmonary/Chest: Effort normal and breath sounds normal. No respiratory distress. She has no wheezes. She has no rales.  Musculoskeletal: She exhibits no edema.  Lymphadenopathy:    She has no cervical adenopathy.  Neurological: She is alert and oriented to person, place, and time.  Skin: Skin is warm and dry. Capillary refill takes less than 2 seconds. Rash noted.  Wart of L posterior shoulder Rash: Erythematous maculopapular rash of forearms and back with excoriations  Psychiatric: She has a normal mood and affect. Her behavior is normal.  Vitals reviewed.       Assessment & Plan:   1. Contact dermatitis, unspecified contact  dermatitis type, unspecified trigger - rash most consistent with contact dermatitis - unclear irritant - discussed keeping hydrated and using topical steroid BID - can continue antihistamine - no signs of infection - discussed return precautions Meds ordered this encounter  Medications  . triamcinolone ointment (KENALOG) 0.5 %    Sig: Apply 1 application topically 2 (two) times daily.    Dispense:  60 g    Refill:  1   2. 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 Pacemaker? no.  Patient informed of risks (permanent scarring, infection, light or dark discoloration, bleeding, infection, weakness, numbness and recurrence of the lesion) and benefits of the procedure and verbal informed consent obtained.  The areas are treated with liquid nitrogen therapy, frozen until ice ball extended 2 mm beyond lesion, allowed to thaw, and treated again. The patient tolerated procedure well.  The patient was instructed on post-op care, warned that there may be blister formation, redness and pain. Recommend OTC analgesia as needed for pain.  Condition: Stable  Complications: none.  Plan: 1. Instructed to keep the area dry and covered for 24-48h and clean thereafter. 2. Warning signs of infection were reviewed.   3. Recommended that the patient use OTC analgesics as needed for pain.  4. Return in 2 months. For repeat treatment prn   Return if symptoms worsen or fail to improve.   The entirety of the information documented in the History of Present Illness, Review of Systems and Physical Exam were personally obtained by me. Portions of this information were initially documented by Presley RaddleNikki Walston, CMA and reviewed by me for thoroughness and accuracy.    Erasmo DownerBacigalupo, Dmiyah Liscano M, MD, MPH [facility] 02/16/2018 10:11 AM

## 2018-03-06 ENCOUNTER — Ambulatory Visit: Payer: Medicare Other | Admitting: Family Medicine

## 2018-03-06 ENCOUNTER — Encounter: Payer: Self-pay | Admitting: Family Medicine

## 2018-03-06 VITALS — BP 118/78 | HR 68 | Temp 98.1°F | Wt 116.8 lb

## 2018-03-06 DIAGNOSIS — Z23 Encounter for immunization: Secondary | ICD-10-CM | POA: Diagnosis not present

## 2018-03-06 DIAGNOSIS — R21 Rash and other nonspecific skin eruption: Secondary | ICD-10-CM

## 2018-03-06 MED ORDER — PREDNISONE 10 MG PO TABS: ORAL_TABLET | ORAL | 0 refills | Status: DC

## 2018-03-06 MED ORDER — HYDROXYZINE HCL 10 MG PO TABS: 10.0000 mg | ORAL_TABLET | Freq: Three times a day (TID) | ORAL | 2 refills | Status: DC | PRN

## 2018-03-06 NOTE — Progress Notes (Signed)
Patient: Robin Stephens Female    DOB: May 19, 1951   67 y.o.   MRN: [id number] Visit Date: 03/08/2018  Today's Provider: Shirlee Latch, MD   Chief Complaint  Patient presents with  . Rash   Subjective:    Rash  This is a recurrent problem. The current episode started 1 to 4 weeks ago. The problem has been gradually worsening since onset. The affected locations include the right elbow, right wrist, right arm, right upper leg, left upper leg, right ankle, left ankle, left arm, left elbow and abdomen. The rash is characterized by dryness, redness and itchiness. Past treatments include topical steroids. The treatment provided no relief.      Allergies  Allergen Reactions  . Sulfa Antibiotics Rash     Current Outpatient Medications:  .  ASHWAGANDHA PO, Take 460 mg by mouth. , Disp: , Rfl:  .  calcium carbonate (OS-CAL) 600 MG TABS tablet, Take by mouth., Disp: , Rfl:  .  escitalopram (LEXAPRO) 10 MG tablet, Take 1 tablet (10 mg total) by mouth daily., Disp: 90 tablet, Rfl: 3 .  Ginger, Zingiber officinalis, 550 MG CAPS, Take by mouth., Disp: , Rfl:  .  Lactobacillus (ACIDOPHILUS) 100 MG CAPS, Take by mouth., Disp: , Rfl:  .  MULTIPLE VITAMINS PO, Take by mouth., Disp: , Rfl:  .  Omega-3 Fatty Acids (FISH OIL) 1200 MG CAPS, Take by mouth., Disp: , Rfl:  .  triamcinolone ointment (KENALOG) 0.5 %, Apply 1 application topically 2 (two) times daily., Disp: 60 g, Rfl: 1 .  vitamin C (ASCORBIC ACID) 500 MG tablet, Take by mouth., Disp: , Rfl:  .  Garlic 1000 MG CAPS, Take by mouth., Disp: , Rfl:  .  hydrOXYzine (ATARAX/VISTARIL) 10 MG tablet, Take 1 tablet (10 mg total) by mouth 3 (three) times daily as needed., Disp: 30 tablet, Rfl: 2 .  predniSONE (DELTASONE) 10 MG tablet, Take 60 mg daily x3d, then 50 mg daily x3d, then 40mg  daily x3d, then 30mg  daily x3d, then 20mg  daily x3d, then 10mg  daily x3d, then d/c, Disp: 63 tablet, Rfl: 0  Review of Systems  Constitutional: Negative.    Respiratory: Negative.   Cardiovascular: Negative.   Genitourinary: Negative.   Musculoskeletal: Negative.   Skin: Positive for rash. Negative for color change and pallor.  Neurological: Negative.   Psychiatric/Behavioral: Negative.     Social History   Tobacco Use  . Smoking status: Never Smoker  . Smokeless tobacco: Never Used  Substance Use Topics  . Alcohol use: Yes    Comment: wine occasionally   Objective:   BP 118/78 (BP Location: Left Arm, Patient Position: Sitting, Cuff Size: Normal)   Pulse 68   Temp 98.1 F (36.7 C) (Oral)   Wt 116 lb 12.8 oz (53 kg)   SpO2 98%   BMI 21.36 kg/m  Vitals:   03/06/18 0817  BP: 118/78  Pulse: 68  Temp: 98.1 F (36.7 C)  TempSrc: Oral  SpO2: 98%  Weight: 116 lb 12.8 oz (53 kg)     Physical Exam  Constitutional: She is oriented to person, place, and time. She appears well-developed and well-nourished. No distress.  HENT:  Head: Normocephalic and atraumatic.  Eyes: Conjunctivae are normal.  Neck: Neck supple. No thyromegaly present.  Cardiovascular: Normal rate, regular rhythm, normal heart sounds and intact distal pulses.  No murmur heard. Pulmonary/Chest: Effort normal and breath sounds normal. No respiratory distress. She has no wheezes. She has no  rales.  Musculoskeletal: She exhibits no edema.  Lymphadenopathy:    She has no cervical adenopathy.  Neurological: She is alert and oriented to person, place, and time.  Skin: Skin is warm and dry. Capillary refill takes less than 2 seconds. Rash (see picture, erythematous, maculopapular,across b/l UEs and chest) noted.  Psychiatric: She has a normal mood and affect. Her behavior is normal.  Vitals reviewed.         Assessment & Plan:   1. Rash and nonspecific skin eruption - rash remains c/w contact dermatitis, but irritant/allergen is unknown - will try slow prednisone taper and hold topical steroids  - will refer to Derm - can use hydroxyzine prn for  itching - Ambulatory referral to Dermatology  2. Need for influenza vaccination - Flu vaccine HIGH DOSE PF   Meds ordered this encounter  Medications  . hydrOXYzine (ATARAX/VISTARIL) 10 MG tablet    Sig: Take 1 tablet (10 mg total) by mouth 3 (three) times daily as needed.    Dispense:  30 tablet    Refill:  2  . predniSONE (DELTASONE) 10 MG tablet    Sig: Take 60 mg daily x3d, then 50 mg daily x3d, then 40mg  daily x3d, then 30mg  daily x3d, then 20mg  daily x3d, then 10mg  daily x3d, then d/c    Dispense:  63 tablet    Refill:  0     Return if symptoms worsen or fail to improve.   The entirety of the information documented in the History of Present Illness, Review of Systems and Physical Exam were personally obtained by me. Portions of this information were initially documented by Kavin Leech and Dara Lords, CMA and reviewed by me for thoroughness and accuracy.    Erasmo Downer, MD, MPH [facility] 03/08/2018 10:35 AM

## 2018-03-06 NOTE — Patient Instructions (Signed)
Prednisone tablets °What is this medicine? °PREDNISONE (PRED ni sone) is a corticosteroid. It is commonly used to treat inflammation of the skin, joints, lungs, and other organs. Common conditions treated include asthma, allergies, and arthritis. It is also used for other conditions, such as blood disorders and diseases of the adrenal glands. °This medicine may be used for other purposes; ask your health care provider or pharmacist if you have questions. °COMMON BRAND NAME(S): Deltasone, Predone, Sterapred, Sterapred DS °What should I tell my health care provider before I take this medicine? °They need to know if you have any of these conditions: °-Cushing's syndrome °-diabetes °-glaucoma °-heart disease °-high blood pressure °-infection (especially a virus infection such as chickenpox, cold sores, or herpes) °-kidney disease °-liver disease °-mental illness °-myasthenia gravis °-osteoporosis °-seizures °-stomach or intestine problems °-thyroid disease °-an unusual or allergic reaction to lactose, prednisone, other medicines, foods, dyes, or preservatives °-pregnant or trying to get pregnant °-breast-feeding °How should I use this medicine? °Take this medicine by mouth with a glass of water. Follow the directions on the prescription label. Take this medicine with food. If you are taking this medicine once a day, take it in the morning. Do not take more medicine than you are told to take. Do not suddenly stop taking your medicine because you may develop a severe reaction. Your doctor will tell you how much medicine to take. If your doctor wants you to stop the medicine, the dose may be slowly lowered over time to avoid any side effects. °Talk to your pediatrician regarding the use of this medicine in children. Special care may be needed. °Overdosage: If you think you have taken too much of this medicine contact a poison control center or emergency room at once. °NOTE: This medicine is only for you. Do not share this  medicine with others. °What if I miss a dose? °If you miss a dose, take it as soon as you can. If it is almost time for your next dose, talk to your doctor or health care professional. You may need to miss a dose or take an extra dose. Do not take double or extra doses without advice. °What may interact with this medicine? °Do not take this medicine with any of the following medications: °-metyrapone °-mifepristone °This medicine may also interact with the following medications: °-aminoglutethimide °-amphotericin B °-aspirin and aspirin-like medicines °-barbiturates °-certain medicines for diabetes, like glipizide or glyburide °-cholestyramine °-cholinesterase inhibitors °-cyclosporine °-digoxin °-diuretics °-ephedrine °-female hormones, like estrogens and birth control pills °-isoniazid °-ketoconazole °-NSAIDS, medicines for pain and inflammation, like ibuprofen or naproxen °-phenytoin °-rifampin °-toxoids °-vaccines °-warfarin °This list may not describe all possible interactions. Give your health care provider a list of all the medicines, herbs, non-prescription drugs, or dietary supplements you use. Also tell them if you smoke, drink alcohol, or use illegal drugs. Some items may interact with your medicine. °What should I watch for while using this medicine? °Visit your doctor or health care professional for regular checks on your progress. If you are taking this medicine over a prolonged period, carry an identification card with your name and address, the type and dose of your medicine, and your doctor's name and address. °This medicine may increase your risk of getting an infection. Tell your doctor or health care professional if you are around anyone with measles or chickenpox, or if you develop sores or blisters that do not heal properly. °If you are going to have surgery, tell your doctor or health care professional that   you have taken this medicine within the last twelve months. °Ask your doctor or health  care professional about your diet. You may need to lower the amount of salt you eat. °This medicine may affect blood sugar levels. If you have diabetes, check with your doctor or health care professional before you change your diet or the dose of your diabetic medicine. °What side effects may I notice from receiving this medicine? °Side effects that you should report to your doctor or health care professional as soon as possible: °-allergic reactions like skin rash, itching or hives, swelling of the face, lips, or tongue °-changes in emotions or moods °-changes in vision °-depressed mood °-eye pain °-fever or chills, cough, sore throat, pain or difficulty passing urine °-increased thirst °-swelling of ankles, feet °Side effects that usually do not require medical attention (report to your doctor or health care professional if they continue or are bothersome): °-confusion, excitement, restlessness °-headache °-nausea, vomiting °-skin problems, acne, thin and shiny skin °-trouble sleeping °-weight gain °This list may not describe all possible side effects. Call your doctor for medical advice about side effects. You may report side effects to FDA at 1-800-FDA-1088. °Where should I keep my medicine? °Keep out of the reach of children. °Store at room temperature between 15 and 30 degrees C (59 and 86 degrees F). Protect from light. Keep container tightly closed. Throw away any unused medicine after the expiration date. °NOTE: This sheet is a summary. It may not cover all possible information. If you have questions about this medicine, talk to your doctor, pharmacist, or health care provider. °© 2018 Elsevier/Gold Standard (2010-12-30 10:57:14) ° °

## 2018-07-11 ENCOUNTER — Ambulatory Visit (INDEPENDENT_AMBULATORY_CARE_PROVIDER_SITE_OTHER): Payer: Medicare Other

## 2018-07-11 VITALS — BP 114/70 | HR 59 | Temp 98.2°F | Ht 62.0 in | Wt 117.0 lb

## 2018-07-11 DIAGNOSIS — Z23 Encounter for immunization: Secondary | ICD-10-CM

## 2018-07-11 DIAGNOSIS — Z0000 Encounter for general adult medical examination without abnormal findings: Secondary | ICD-10-CM

## 2018-07-11 NOTE — Patient Instructions (Addendum)
[name] , Thank you for taking time to come for your Medicare Wellness Visit. I appreciate your ongoing commitment to your health goals. Please review the following plan we discussed and let me know if I can assist you in the future.   Screening recommendations/referrals: Colonoscopy: Up to date, due 08/2024 Mammogram: Up to date, due 07/2019 Bone Density: Up to date, due 07/2022 Recommended yearly ophthalmology/optometry visit for glaucoma screening and checkup Recommended yearly dental visit for hygiene and checkup  Vaccinations: Influenza vaccine: Up to date Pneumococcal vaccine: Completed series today. Tdap vaccine: Up to date, due 12/2022 Shingles vaccine: Completed series    Advanced directives: Advance directive discussed with you today. I have provided a copy for you to complete at home and have notarized. Once this is complete please bring a copy in to our office so we can scan it into your chart.  Conditions/risks identified: Recommend to drink at least 6-8 8oz glasses of water per day.  Next appointment: 07/18/18 @ 9:00 AM with Dr Beryle Flock. Declined scheduling the AWV for 2021 at this time.    Preventive Care 66 Years and Older, Female Preventive care refers to lifestyle choices and visits with your health care provider that can promote health and wellness. What does preventive care include?  A yearly physical exam. This is also called an annual well check.  Dental exams once or twice a year.  Routine eye exams. Ask your health care provider how often you should have your eyes checked.  Personal lifestyle choices, including:  Daily care of your teeth and gums.  Regular physical activity.  Eating a healthy diet.  Avoiding tobacco and drug use.  Limiting alcohol use.  Practicing safe sex.  Taking low-dose aspirin every day.  Taking vitamin and mineral supplements as recommended by your health care provider. What happens during an annual well check? The  services and screenings done by your health care provider during your annual well check will depend on your age, overall health, lifestyle risk factors, and family history of disease. Counseling  Your health care provider may ask you questions about your:  Alcohol use.  Tobacco use.  Drug use.  Emotional well-being.  Home and relationship well-being.  Sexual activity.  Eating habits.  History of falls.  Memory and ability to understand (cognition).  Work and work Astronomer.  Reproductive health. Screening  You may have the following tests or measurements:  Height, weight, and BMI.  Blood pressure.  Lipid and cholesterol levels. These may be checked every 5 years, or more frequently if you are over 51 years old.  Skin check.  Lung cancer screening. You may have this screening every year starting at age 70 if you have a 30-pack-year history of smoking and currently smoke or have quit within the past 15 years.  Fecal occult blood test (FOBT) of the stool. You may have this test every year starting at age 65.  Flexible sigmoidoscopy or colonoscopy. You may have a sigmoidoscopy every 5 years or a colonoscopy every 10 years starting at age 70.  Hepatitis C blood test.  Hepatitis B blood test.  Sexually transmitted disease (STD) testing.  Diabetes screening. This is done by checking your blood sugar (glucose) after you have not eaten for a while (fasting). You may have this done every 1-3 years.  Bone density scan. This is done to screen for osteoporosis. You may have this done starting at age 58.  Mammogram. This may be done every 1-2 years. Talk to  your health care provider about how often you should have regular mammograms. Talk with your health care provider about your test results, treatment options, and if necessary, the need for more tests. Vaccines  Your health care provider may recommend certain vaccines, such as:  Influenza vaccine. This is recommended  every year.  Tetanus, diphtheria, and acellular pertussis (Tdap, Td) vaccine. You may need a Td booster every 10 years.  Zoster vaccine. You may need this after age 70.  Pneumococcal 13-valent conjugate (PCV13) vaccine. One dose is recommended after age 28.  Pneumococcal polysaccharide (PPSV23) vaccine. One dose is recommended after age 52. Talk to your health care provider about which screenings and vaccines you need and how often you need them. This information is not intended to replace advice given to you by your health care provider. Make sure you discuss any questions you have with your health care provider. Document Released: 06/12/2015 Document Revised: 02/03/2016 Document Reviewed: 03/17/2015 Elsevier Interactive Patient Education  2017 [place] Prevention in the Home Falls can cause injuries. They can happen to people of all ages. There are many things you can do to make your home safe and to help prevent falls. What can I do on the outside of my home?  Regularly fix the edges of walkways and driveways and fix any cracks.  Remove anything that might make you trip as you walk through a door, such as a raised step or threshold.  Trim any bushes or trees on the path to your home.  Use bright outdoor lighting.  Clear any walking paths of anything that might make someone trip, such as rocks or tools.  Regularly check to see if handrails are loose or broken. Make sure that both sides of any steps have handrails.  Any raised decks and porches should have guardrails on the edges.  Have any leaves, snow, or ice cleared regularly.  Use sand or salt on walking paths during winter.  Clean up any spills in your garage right away. This includes oil or grease spills. What can I do in the bathroom?  Use night lights.  Install grab bars by the toilet and in the tub and shower. Do not use towel bars as grab bars.  Use non-skid mats or decals in the tub or shower.  If  you need to sit down in the shower, use a plastic, non-slip stool.  Keep the floor dry. Clean up any water that spills on the floor as soon as it happens.  Remove soap buildup in the tub or shower regularly.  Attach bath mats securely with double-sided non-slip rug tape.  Do not have throw rugs and other things on the floor that can make you trip. What can I do in the bedroom?  Use night lights.  Make sure that you have a light by your bed that is easy to reach.  Do not use any sheets or blankets that are too big for your bed. They should not hang down onto the floor.  Have a firm chair that has side arms. You can use this for support while you get dressed.  Do not have throw rugs and other things on the floor that can make you trip. What can I do in the kitchen?  Clean up any spills right away.  Avoid walking on wet floors.  Keep items that you use a lot in easy-to-reach places.  If you need to reach something above you, use a strong step stool that has  a grab bar.  Keep electrical cords out of the way.  Do not use floor polish or wax that makes floors slippery. If you must use wax, use non-skid floor wax.  Do not have throw rugs and other things on the floor that can make you trip. What can I do with my stairs?  Do not leave any items on the stairs.  Make sure that there are handrails on both sides of the stairs and use them. Fix handrails that are broken or loose. Make sure that handrails are as long as the stairways.  Check any carpeting to make sure that it is firmly attached to the stairs. Fix any carpet that is loose or worn.  Avoid having throw rugs at the top or bottom of the stairs. If you do have throw rugs, attach them to the floor with carpet tape.  Make sure that you have a light switch at the top of the stairs and the bottom of the stairs. If you do not have them, ask someone to add them for you. What else can I do to help prevent falls?  Wear shoes  that:  Do not have high heels.  Have rubber bottoms.  Are comfortable and fit you well.  Are closed at the toe. Do not wear sandals.  If you use a stepladder:  Make sure that it is fully opened. Do not climb a closed stepladder.  Make sure that both sides of the stepladder are locked into place.  Ask someone to hold it for you, if possible.  Clearly mark and make sure that you can see:  Any grab bars or handrails.  First and last steps.  Where the edge of each step is.  Use tools that help you move around (mobility aids) if they are needed. These include:  Canes.  Walkers.  Scooters.  Crutches.  Turn on the lights when you go into a dark area. Replace any light bulbs as soon as they burn out.  Set up your furniture so you have a clear path. Avoid moving your furniture around.  If any of your floors are uneven, fix them.  If there are any pets around you, be aware of where they are.  Review your medicines with your doctor. Some medicines can make you feel dizzy. This can increase your chance of falling. Ask your doctor what other things that you can do to help prevent falls. This information is not intended to replace advice given to you by your health care provider. Make sure you discuss any questions you have with your health care provider. Document Released: 03/12/2009 Document Revised: 10/22/2015 Document Reviewed: 06/20/2014 Elsevier Interactive Patient Education  2017 [employer].

## 2018-07-11 NOTE — Progress Notes (Signed)
Subjective:   Robin Stephens is a 68 y.o. female who presents for an Initial Medicare Annual Wellness Visit.  Review of Systems    N/A  Cardiac Risk Factors include: advanced age (>2555men, 67>65 women)     Objective:    Today's Vitals   07/11/18 0845  BP: (!) 148/72  Pulse: (!) 59  Temp: 98.2 F (36.8 C)  TempSrc: Oral  Weight: 117 lb (53.1 kg)  Height: 5\' 2"  (1.575 m)  PainSc: 0-No pain   Body mass index is 21.4 kg/m.  Advanced Directives 07/11/2018 07/11/2018  Does Patient Have a Medical Advance Directive? No Yes  Type of Advance Directive - Healthcare Power of [place];Living will  Does patient want to make changes to medical advance directive? Yes (MAU/Ambulatory/Procedural Areas - Information given) -  Copy of Healthcare Power of Attorney in Chart? - No - copy requested    Current Medications (verified) Outpatient Encounter Medications as of 07/11/2018  Medication Sig  . calcium carbonate (OS-CAL) 600 MG TABS tablet Take 1,200 mg by mouth daily with breakfast.   . escitalopram (LEXAPRO) 10 MG tablet Take 1 tablet (10 mg total) by mouth daily.  . Ginger, Zingiber officinalis, 550 MG CAPS Take by mouth daily.   . Lactobacillus (ACIDOPHILUS) 100 MG CAPS Take by mouth daily.   . MULTIPLE VITAMINS PO Take by mouth daily.   . Omega-3 Fatty Acids (FISH OIL) 1200 MG CAPS Take by mouth daily.   . vitamin C (ASCORBIC ACID) 500 MG tablet Take 500 mg by mouth daily.   . ASHWAGANDHA PO Take 460 mg by mouth.   . Garlic 1000 MG CAPS Take by mouth.  . hydrOXYzine (ATARAX/VISTARIL) 10 MG tablet Take 1 tablet (10 mg total) by mouth 3 (three) times daily as needed. (Patient not taking: Reported on 07/11/2018)  . predniSONE (DELTASONE) 10 MG tablet Take 60 mg daily x3d, then 50 mg daily x3d, then 40mg  daily x3d, then 30mg  daily x3d, then 20mg  daily x3d, then 10mg  daily x3d, then d/c (Patient not taking: Reported on 07/11/2018)  . triamcinolone ointment (KENALOG) 0.5 % Apply 1 application  topically 2 (two) times daily. (Patient not taking: Reported on 07/11/2018)   No facility-administered encounter medications on file as of 07/11/2018.     Allergies (verified) Sulfa antibiotics   History: History reviewed. No pertinent past medical history. Past Surgical History:  Procedure Laterality Date  . APPENDECTOMY  2006  . EYE SURGERY     PRK   Family History  Problem Relation Age of Onset  . Heart disease Father   . Obesity Sister   . Cancer Sister   . Breast cancer Sister [id number]       radiation; in remission  . Breast cancer Mother [id number]  . Healthy Sister    Social History   Socioeconomic History  . Marital status: Married    Spouse name: Clide CliffRicky  . Number of children: 2  . Years of education: 2215  . Highest education level: Some college, no degree  Occupational History  . Occupation: retired  Engineer, productionocial Needs  . Financial resource strain: Not hard at all  . Food insecurity:    Worry: Never true    Inability: Never true  . Transportation needs:    Medical: No    Non-medical: No  Tobacco Use  . Smoking status: Never Smoker  . Smokeless tobacco: Never Used  Substance and Sexual Activity  . Alcohol use: Yes    Comment: wine occasionally/monthly  .  Drug use: No  . Sexual activity: Not on file  Lifestyle  . Physical activity:    Days per week: 5 days    Minutes per session: 30 min  . Stress: Only a little  Relationships  . Social connections:    Talks on phone: Patient refused    Gets together: Patient refused    Attends religious service: Patient refused    Active member of club or organization: Patient refused    Attends meetings of clubs or organizations: Patient refused    Relationship status: Patient refused  Other Topics Concern  . Not on file  Social History Narrative  . Not on file    Tobacco Counseling Counseling given: Not Answered   Clinical Intake:  Pre-visit preparation completed: Yes  Pain : No/denies pain Pain Score: 0-No pain      Nutritional Status: BMI of 19-24  Normal Nutritional Risks: None Diabetes: No  How often do you need to have someone help you when you read instructions, pamphlets, or other written materials from your doctor or pharmacy?: 1 - Never  Interpreter Needed?: No  Information entered by :: [facility], LPN   Activities of Daily Living In your present state of health, do you have any difficulty performing the following activities: 07/11/2018  Hearing? N  Vision? N  Difficulty concentrating or making decisions? N  Walking or climbing stairs? N  Dressing or bathing? N  Doing errands, shopping? N  Preparing Food and eating ? N  Using the Toilet? N  In the past six months, have you accidently leaked urine? N  Do you have problems with loss of bowel control? N  Managing your Medications? N  Managing your Finances? N  Housekeeping or managing your Housekeeping? N  Some recent data might be hidden     Immunizations and Health Maintenance Immunization History  Administered Date(s) Administered  . Influenza, High Dose Seasonal PF 03/06/2018  . Influenza,inj,Quad PF,6+ Mos 02/27/2017  . Pneumococcal Conjugate-13 07/10/2017  . Td 10/17/2003  . Tdap 01/01/2013  . Zoster Recombinat (Shingrix) 12/05/2017, 02/06/2018   There are no preventive care reminders to display for this patient.  Patient Care Team: Erasmo Downer, MD as PCP - General (Family Medicine) [facility], [facility]  Indicate any recent Medical Services you may have received from other than [facility] providers in the past year (date may be approximate).     Assessment:   This is a routine wellness examination for Robin Stephens.  Hearing/Vision screen Vision Screening Comments: Pt goes to [facility] for [facility] exams every 2 years.   Dietary issues and exercise activities discussed: Current Exercise Habits: Home exercise routine, Type of exercise: Other - see comments;stretching;strength  training/weights(eliptical), Time (Minutes): 50, Frequency (Times/Week): 5, Weekly Exercise (Minutes/Week): 250, Intensity: Moderate, Exercise limited by: None identified  Goals    . DIET - INCREASE WATER INTAKE     Recommend to drink at least 6-8 8oz glasses of water per day.      Depression Screen PHQ 2/9 Scores 07/11/2018 07/10/2017 06/28/2016  PHQ - 2 Score 0 0 0    Fall Risk Fall Risk  07/11/2018 07/10/2017  Falls in the past year? 0 No    FALL RISK PREVENTION PERTAINING TO THE HOME:  Any stairs in or around the home WITH handrails? No  Home free of loose throw rugs in walkways, pet beds, electrical cords, etc? Yes  Adequate lighting in your home to reduce risk of falls? Yes   ASSISTIVE  DEVICES UTILIZED TO PREVENT FALLS:  Life alert? No  Use of a cane, walker or w/c? No  Grab bars in the bathroom? No  Shower chair or bench in shower? No  Elevated toilet seat or a handicapped toilet? No    TIMED UP AND GO:  Was the test performed? No .    Cognitive Function: Declined today.         Screening Tests Health Maintenance  Topic Date Due  . MAMMOGRAM  08/09/2019  . DEXA SCAN  08/09/2022  . TETANUS/TDAP  01/02/2023  . COLONOSCOPY  09/24/2024  . INFLUENZA VACCINE  Completed  . Hepatitis C Screening  Completed  . PNA vac Low Risk Adult  Completed    Qualifies for Shingles Vaccine? Up to date  Tdap: Up to date  Flu Vaccine: Up to date  Pneumococcal Vaccine: Up to date   Cancer Screenings:  Colorectal Screening: Completed 09/25/14. Repeat every 10 years.  Mammogram: Completed 08/08/17.   Bone Density: Completed 08/08/17. Results reflect OSTEOPENIA. Repeat every 5 years.   Lung Cancer Screening: (Low Dose CT Chest recommended if Age 32-80 years, 30 pack-year currently smoking OR have quit w/in 15years.) does not qualify.    Additional Screening:  Hepatitis C Screening: Up to date  Vision Screening: Recommended annual ophthalmology exams for early  detection of glaucoma and other disorders of the eye.  Dental Screening: Recommended annual dental exams for proper oral hygiene  Community Resource Referral:  CRR required this visit?  No       Plan:  I have personally reviewed and addressed the Medicare Annual Wellness questionnaire and have noted the following in the patient's chart:  A. Medical and social history B. Use of alcohol, tobacco or illicit drugs  C. Current medications and supplements D. Functional ability and status E.  Nutritional status F.  Physical activity G. Advance directives H. List of other physicians I.  Hospitalizations, surgeries, and ER visits in previous 12 months J.  Vitals K. Screenings such as hearing and vision if needed, cognitive and depression L. Referrals and appointments - none  In addition, I have reviewed and discussed with patient certain preventive protocols, quality metrics, and best practice recommendations. A written personalized care plan for preventive services as well as general preventive health recommendations were provided to patient.  See attached scanned questionnaire for additional information.   Signed,  Hyacinth MeekerMckenzie Tamberly Pomplun, LPN Nurse Health Advisor   Nurse Recommendations: None

## 2018-07-18 ENCOUNTER — Ambulatory Visit (INDEPENDENT_AMBULATORY_CARE_PROVIDER_SITE_OTHER): Payer: Medicare Other | Admitting: Family Medicine

## 2018-07-18 ENCOUNTER — Encounter: Payer: Self-pay | Admitting: Family Medicine

## 2018-07-18 VITALS — BP 138/82 | HR 60 | Temp 97.9°F | Ht 61.0 in | Wt 115.4 lb

## 2018-07-18 DIAGNOSIS — Z0000 Encounter for general adult medical examination without abnormal findings: Secondary | ICD-10-CM

## 2018-07-18 DIAGNOSIS — F5104 Psychophysiologic insomnia: Secondary | ICD-10-CM

## 2018-07-18 DIAGNOSIS — Z1239 Encounter for other screening for malignant neoplasm of breast: Secondary | ICD-10-CM | POA: Diagnosis not present

## 2018-07-18 DIAGNOSIS — E7800 Pure hypercholesterolemia, unspecified: Secondary | ICD-10-CM

## 2018-07-18 DIAGNOSIS — F419 Anxiety disorder, unspecified: Secondary | ICD-10-CM | POA: Diagnosis not present

## 2018-07-18 MED ORDER — ESCITALOPRAM OXALATE 10 MG PO TABS: 10.0000 mg | ORAL_TABLET | Freq: Every day | ORAL | 3 refills | Status: DC

## 2018-07-18 NOTE — Progress Notes (Signed)
Patient: Robin Stephens, Female    DOB: 1950/11/05, 68 y.o.   MRN: [id number] Visit Date: 07/18/2018  Today's Provider: Shirlee Latch, MD   Chief Complaint  Patient presents with  . Annual Exam   Subjective:    I, Presley Raddle, CMA, am acting as a scribe for Shirlee Latch, MD.   Patient had a AWE with [facility] on 07/11/2018   Complete Physical EUNITA Stephens is a 68 y.o. female. She feels fairly well. She reports experiencing some upper respiratory symptoms. She reports exercising 5-6 days per week. She reports she is sleeping well.  -----------------------------------------------------------   Review of Systems  Constitutional: Negative.   HENT: Positive for congestion.   Eyes: Negative.   Respiratory: Negative.   Cardiovascular: Negative.   Gastrointestinal: Negative.   Endocrine: Negative.   Genitourinary: Negative.   Musculoskeletal: Positive for arthralgias.  Skin: Negative.   Allergic/Immunologic: Negative.   Neurological: Negative.   Hematological: Negative.   Psychiatric/Behavioral: Negative.     Social History   Socioeconomic History  . Marital status: Married    Spouse name: Clide Cliff  . Number of children: 2  . Years of education: 81  . Highest education level: Some college, no degree  Occupational History  . Occupation: retired  Engineer, production  . Financial resource strain: Not hard at all  . Food insecurity:    Worry: Never true    Inability: Never true  . Transportation needs:    Medical: No    Non-medical: No  Tobacco Use  . Smoking status: Never Smoker  . Smokeless tobacco: Never Used  Substance and Sexual Activity  . Alcohol use: Yes    Comment: wine occasionally/monthly  . Drug use: No  . Sexual activity: Not on file  Lifestyle  . Physical activity:    Days per week: 5 days    Minutes per session: 30 min  . Stress: Only a little  Relationships  . Social connections:    Talks on phone: Patient refused    Gets together:  Patient refused    Attends religious service: Patient refused    Active member of club or organization: Patient refused    Attends meetings of clubs or organizations: Patient refused    Relationship status: Patient refused  . Intimate partner violence:    Fear of current or ex partner: Patient refused    Emotionally abused: Patient refused    Physically abused: Patient refused    Forced sexual activity: Patient refused  Other Topics Concern  . Not on file  Social History Narrative  . Not on file    History reviewed. No pertinent past medical history.   Patient Active Problem List   Diagnosis Date Noted  . Anxiety 01/21/2015  . 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 Hypercholesteremia 01/21/2015  . Insomnia 01/21/2015    Past Surgical History:  Procedure Laterality Date  . APPENDECTOMY  2006  . EYE SURGERY     PRK    Her family history includes Breast cancer (age of onset: 35) in her mother; Breast cancer (age of onset: 38) in her sister; Cancer in her sister; Healthy in her sister; Heart disease in her father; Obesity in her sister.   Current Outpatient Medications:  .  ASHWAGANDHA PO, Take 460 mg by mouth. , Disp: , Rfl:  .  calcium carbonate (OS-CAL) 600 MG TABS tablet, Take 1,200 mg by mouth daily with breakfast. , Disp: , Rfl:  .  escitalopram (LEXAPRO)  10 MG tablet, Take 1 tablet (10 mg total) by mouth daily., Disp: 90 tablet, Rfl: 3 .  Garlic 1000 MG CAPS, Take by mouth., Disp: , Rfl:  .  Ginger, Zingiber officinalis, 550 MG CAPS, Take by mouth daily. , Disp: , Rfl:  .  Lactobacillus (ACIDOPHILUS) 100 MG CAPS, Take by mouth daily. , Disp: , Rfl:  .  MULTIPLE VITAMINS PO, Take by mouth daily. , Disp: , Rfl:  .  Omega-3 Fatty Acids (FISH OIL) 1200 MG CAPS, Take by mouth daily. , Disp: , Rfl:  .  vitamin C (ASCORBIC ACID) 500 MG tablet, Take 500 mg by mouth daily. , Disp: , Rfl:  .  hydrOXYzine (ATARAX/VISTARIL) 10 MG tablet, Take 1 tablet (10 mg total) by mouth 3  (three) times daily as needed. (Patient not taking: Reported on 07/18/2018), Disp: 30 tablet, Rfl: 2  Patient Care Team: Erasmo Downer, MD as PCP - General (Family Medicine) [facility], [facility]     Objective:    Vitals: BP 138/82 (BP Location: Right Arm, Patient Position: Sitting, Cuff Size: Normal)   Pulse 60   Temp 97.9 F (36.6 C) (Oral)   Ht 5\' 1"  (1.549 m)   Wt 115 lb 6.4 oz (52.3 kg)   SpO2 99%   BMI 21.80 kg/m   Physical Exam Vitals signs reviewed.  Constitutional:      General: She is not in acute distress.    Appearance: Normal appearance. She is well-developed. She is not diaphoretic.  HENT:     Head: Normocephalic and atraumatic.     Right Ear: Tympanic membrane, ear canal and external ear normal.     Left Ear: Tympanic membrane, ear canal and external ear normal.     Nose: Nose normal.     Mouth/Throat:     Mouth: Mucous membranes are moist.     Pharynx: Oropharynx is clear. No oropharyngeal exudate.  Eyes:     General: No scleral icterus.    Conjunctiva/sclera: Conjunctivae normal.     Pupils: Pupils are equal, round, and reactive to light.  Neck:     Musculoskeletal: Neck supple.     Thyroid: No thyromegaly.  Cardiovascular:     Rate and Rhythm: Normal rate and regular rhythm.     Pulses: Normal pulses.     Heart sounds: Normal heart sounds. No murmur.  Pulmonary:     Effort: Pulmonary effort is normal. No respiratory distress.     Breath sounds: Normal breath sounds. No wheezing or rales.  Abdominal:     General: Bowel sounds are normal. There is no distension.     Palpations: Abdomen is soft.     Tenderness: There is no abdominal tenderness. There is no guarding or rebound.  Musculoskeletal:        General: No deformity.     Right lower leg: No edema.     Left lower leg: No edema.  Lymphadenopathy:     Cervical: No cervical adenopathy.  Skin:    General: Skin is warm and dry.     Capillary Refill: Capillary refill takes less than  2 seconds.     Findings: No rash.  Neurological:     Mental Status: She is alert and oriented to person, place, and time. Mental status is at baseline.  Psychiatric:        Mood and Affect: Mood normal.        Behavior: Behavior normal.        Thought  Content: Thought content normal.     Activities of Daily Living In your present state of health, do you have any difficulty performing the following activities: 07/11/2018  Hearing? N  Vision? N  Difficulty concentrating or making decisions? N  Walking or climbing stairs? N  Dressing or bathing? N  Doing errands, shopping? N  Preparing Food and eating ? N  Using the Toilet? N  In the past six months, have you accidently leaked urine? N  Do you have problems with loss of bowel control? N  Managing your Medications? N  Managing your Finances? N  Housekeeping or managing your Housekeeping? N  Some recent data might be hidden    Fall Risk Assessment Fall Risk  07/11/2018 07/10/2017  Falls in the past year? 0 No     Depression Screen PHQ 2/9 Scores 07/11/2018 07/10/2017 06/28/2016  PHQ - 2 Score 0 0 0    No flowsheet data found.     Assessment & Plan:    Annual Physical Reviewed patient's Family Medical History Reviewed and updated list of patient's medical providers Assessment of cognitive impairment was done Assessed patient's functional ability Established a written schedule for health screening services Health Risk Assessent Completed and Reviewed  Exercise Activities and Dietary recommendations Goals    . DIET - INCREASE WATER INTAKE     Recommend to drink at least 6-8 8oz glasses of water per day.       Immunization History  Administered Date(s) Administered  . Influenza, High Dose Seasonal PF 03/06/2018  . Influenza,inj,Quad PF,6+ Mos 02/27/2017  . Pneumococcal Conjugate-13 07/10/2017  . Pneumococcal Polysaccharide-23 07/11/2018  . Td 10/17/2003  . Tdap 01/01/2013  . Zoster Recombinat (Shingrix)  12/05/2017, 02/06/2018    Health Maintenance  Topic Date Due  . MAMMOGRAM  08/09/2019  . DEXA SCAN  08/09/2022  . TETANUS/TDAP  01/02/2023  . COLONOSCOPY  09/24/2024  . INFLUENZA VACCINE  Completed  . Hepatitis C Screening  Completed  . PNA vac Low Risk Adult  Completed     Discussed health benefits of physical activity, and encouraged her to engage in regular exercise appropriate for her age and condition.    ------------------------------------------------------------------------------------------------------------  Problem List Items Addressed This Visit      Other   Anxiety    Stable and well controlled Continue LExapro      Relevant Medications   escitalopram (LEXAPRO) 10 MG tablet   Other Relevant Orders   Comprehensive metabolic panel   CBC w/Diff/Platelet   Hypercholesteremia    Recheck FLP and CMP Not on a statin No h/o CVA or heart disease      Relevant Orders   Lipid panel   Insomnia    Well controlled 2/2 anxiety Continue lexapro      Relevant Orders   Comprehensive metabolic panel   CBC w/Diff/Platelet    Other Visit Diagnoses    Encounter for annual physical exam    -  Primary   Relevant Orders   MM 3D SCREEN BREAST BILATERAL   Lipid panel   Comprehensive metabolic panel   CBC w/Diff/Platelet   Screening for breast cancer       Relevant Orders   MM 3D SCREEN BREAST BILATERAL       Return in about 1 year (around 07/19/2019) for CPE/AWV.   The entirety of the information documented in the History of Present Illness, Review of Systems and Physical Exam were personally obtained by me. Portions of this information were initially documented by  Presley RaddleNikki Walston, CMA and reviewed by me for thoroughness and accuracy.    Erasmo DownerBacigalupo,  M, MD, MPH [facility] 07/18/2018 12:16 PM

## 2018-07-18 NOTE — Assessment & Plan Note (Signed)
Recheck FLP and CMP Not on a statin No h/o CVA or heart disease

## 2018-07-18 NOTE — Assessment & Plan Note (Signed)
Stable and well controlled Continue LExapro

## 2018-07-18 NOTE — Patient Instructions (Signed)
Preventive Care 66 Years and Older, Female Preventive care refers to lifestyle choices and visits with your health care provider that can promote health and wellness. What does preventive care include?  A yearly physical exam. This is also called an annual well check.  Dental exams once or twice a year.  Routine eye exams. Ask your health care provider how often you should have your eyes checked.  Personal lifestyle choices, including: ? Daily care of your teeth and gums. ? Regular physical activity. ? Eating a healthy diet. ? Avoiding tobacco and drug use. ? Limiting alcohol use. ? Practicing safe sex. ? Taking low-dose aspirin every day. ? Taking vitamin and mineral supplements as recommended by your health care provider. What happens during an annual well check? The services and screenings done by your health care provider during your annual well check will depend on your age, overall health, lifestyle risk factors, and family history of disease. Counseling Your health care provider may ask you questions about your:  Alcohol use.  Tobacco use.  Drug use.  Emotional well-being.  Home and relationship well-being.  Sexual activity.  Eating habits.  History of falls.  Memory and ability to understand (cognition).  Work and work Statistician.  Reproductive health.  Screening You may have the following tests or measurements:  Height, weight, and BMI.  Blood pressure.  Lipid and cholesterol levels. These may be checked every 5 years, or more frequently if you are over 30 years old.  Skin check.  Lung cancer screening. You may have this screening every year starting at age 27 if you have a 30-pack-year history of smoking and currently smoke or have quit within the past 15 years.  Colorectal cancer screening. All adults should have this screening starting at age 33 and continuing until age 46. You will have tests every 1-10 years, depending on your results and the  type of screening test. People at increased risk should start screening at an earlier age. Screening tests may include: ? Guaiac-based fecal occult blood testing. ? Fecal immunochemical test (FIT). ? Stool DNA test. ? Virtual colonoscopy. ? Sigmoidoscopy. During this test, a flexible tube with a tiny camera (sigmoidoscope) is used to examine your rectum and lower colon. The sigmoidoscope is inserted through your anus into your rectum and lower colon. ? Colonoscopy. During this test, a long, thin, flexible tube with a tiny camera (colonoscope) is used to examine your entire colon and rectum.  Hepatitis C blood test.  Hepatitis B blood test.  Sexually transmitted disease (STD) testing.  Diabetes screening. This is done by checking your blood sugar (glucose) after you have not eaten for a while (fasting). You may have this done every 1-3 years.  Bone density scan. This is done to screen for osteoporosis. You may have this done starting at age 37.  Mammogram. This may be done every 1-2 years. Talk to your health care provider about how often you should have regular mammograms. Talk with your health care provider about your test results, treatment options, and if necessary, the need for more tests. Vaccines Your health care provider may recommend certain vaccines, such as:  Influenza vaccine. This is recommended every year.  Tetanus, diphtheria, and acellular pertussis (Tdap, Td) vaccine. You may need a Td booster every 10 years.  Varicella vaccine. You may need this if you have not been vaccinated.  Zoster vaccine. You may need this after age 38.  Measles, mumps, and rubella (MMR) vaccine. You may need at least  one dose of MMR if you were born in 1957 or later. You may also need a second dose.  Pneumococcal 13-valent conjugate (PCV13) vaccine. One dose is recommended after age 24.  Pneumococcal polysaccharide (PPSV23) vaccine. One dose is recommended after age 24.  Meningococcal  vaccine. You may need this if you have certain conditions.  Hepatitis A vaccine. You may need this if you have certain conditions or if you travel or work in places where you may be exposed to hepatitis A.  Hepatitis B vaccine. You may need this if you have certain conditions or if you travel or work in places where you may be exposed to hepatitis B.  Haemophilus influenzae type b (Hib) vaccine. You may need this if you have certain conditions. Talk to your health care provider about which screenings and vaccines you need and how often you need them. This information is not intended to replace advice given to you by your health care provider. Make sure you discuss any questions you have with your health care provider. Document Released: 06/12/2015 Document Revised: 07/06/2017 Document Reviewed: 03/17/2015 Elsevier Interactive Patient Education  2019 [employer].

## 2018-07-18 NOTE — Assessment & Plan Note (Signed)
Well controlled ?2/2 anxiety ?Continue lexapro ?

## 2018-07-27 LAB — CBC WITH DIFFERENTIAL/PLATELET
BASOS ABS: 0 10*3/uL (ref 0.0–0.2)
BASOS: 1 %
EOS (ABSOLUTE): 0.1 10*3/uL (ref 0.0–0.4)
Eos: 3 %
Hematocrit: 41.2 % (ref 34.0–46.6)
Hemoglobin: 14 g/dL (ref 11.1–15.9)
IMMATURE GRANULOCYTES: 0 %
Immature Grans (Abs): 0 10*3/uL (ref 0.0–0.1)
Lymphocytes Absolute: 1.4 10*3/uL (ref 0.7–3.1)
Lymphs: 30 %
MCH: 32.3 pg (ref 26.6–33.0)
MCHC: 34 g/dL (ref 31.5–35.7)
MCV: 95 fL (ref 79–97)
MONOS ABS: 0.6 10*3/uL (ref 0.1–0.9)
Monocytes: 13 %
Neutrophils Absolute: 2.4 10*3/uL (ref 1.4–7.0)
Neutrophils: 53 %
PLATELETS: 226 10*3/uL (ref 150–450)
RBC: 4.34 x10E6/uL (ref 3.77–5.28)
RDW: 11.8 % (ref 11.7–15.4)
WBC: 4.5 10*3/uL (ref 3.4–10.8)

## 2018-07-27 LAB — COMPREHENSIVE METABOLIC PANEL
ALBUMIN: 4.4 g/dL (ref 3.8–4.8)
ALT: 18 IU/L (ref 0–32)
AST: 16 IU/L (ref 0–40)
Albumin/Globulin Ratio: 1.7 (ref 1.2–2.2)
Alkaline Phosphatase: 54 IU/L (ref 39–117)
BUN/Creatinine Ratio: 27 (ref 12–28)
BUN: 19 mg/dL (ref 8–27)
Bilirubin Total: 0.5 mg/dL (ref 0.0–1.2)
CHLORIDE: 104 mmol/L (ref 96–106)
CO2: 25 mmol/L (ref 20–29)
CREATININE: 0.71 mg/dL (ref 0.57–1.00)
Calcium: 9.4 mg/dL (ref 8.7–10.3)
GFR calc Af Amer: 103 mL/min/{1.73_m2} (ref >59)
GFR calc non Af Amer: 89 mL/min/{1.73_m2} (ref >59)
GLOBULIN, TOTAL: 2.6 g/dL (ref 1.5–4.5)
GLUCOSE: 88 mg/dL (ref 65–99)
POTASSIUM: 4.5 mmol/L (ref 3.5–5.2)
SODIUM: 142 mmol/L (ref 134–144)
TOTAL PROTEIN: 7 g/dL (ref 6.0–8.5)

## 2018-07-27 LAB — LIPID PANEL
Chol/HDL Ratio: 2.8 ratio (ref 0.0–4.4)
Cholesterol, Total: 250 mg/dL — ABNORMAL HIGH (ref 100–199)
HDL: 89 mg/dL (ref >39)
LDL Calculated: 146 mg/dL — ABNORMAL HIGH (ref 0–99)
Triglycerides: 73 mg/dL (ref 0–149)
VLDL Cholesterol Cal: 15 mg/dL (ref 5–40)

## 2018-07-30 ENCOUNTER — Telehealth: Payer: Self-pay

## 2018-07-30 NOTE — Telephone Encounter (Signed)
Patient was advised.  

## 2018-07-30 NOTE — Telephone Encounter (Signed)
-----   Message from Erasmo Downer, MD sent at 07/30/2018 12:40 PM EST ----- Normal kidney function, liver function, electrolytes, Blood counts.  Cholesterol is stable and 10 year risk of heart disease/stroke is remains borderline at 5.8%.   Can continue to monitor

## 2018-08-13 ENCOUNTER — Other Ambulatory Visit: Payer: Self-pay

## 2018-08-13 ENCOUNTER — Ambulatory Visit
Admission: RE | Admit: 2018-08-13 | Discharge: 2018-08-13 | Disposition: A | Discharge status: Home / Self Care | Payer: Medicare Other | Source: Ambulatory Visit | Attending: Family Medicine | Admitting: Family Medicine

## 2018-08-13 DIAGNOSIS — Z1231 Encounter for screening mammogram for malignant neoplasm of breast: Secondary | ICD-10-CM | POA: Diagnosis not present

## 2018-08-13 DIAGNOSIS — Z0000 Encounter for general adult medical examination without abnormal findings: Secondary | ICD-10-CM | POA: Diagnosis present

## 2018-08-13 DIAGNOSIS — Z1239 Encounter for other screening for malignant neoplasm of breast: Secondary | ICD-10-CM

## 2018-09-13 ENCOUNTER — Other Ambulatory Visit: Payer: Self-pay | Admitting: Family Medicine

## 2018-09-13 MED ORDER — ESCITALOPRAM OXALATE 10 MG PO TABS: 10.0000 mg | ORAL_TABLET | Freq: Every day | ORAL | 1 refills | Status: DC

## 2018-09-13 NOTE — Telephone Encounter (Signed)
[facility] faxed refill request for the following medications:   escitalopram (LEXAPRO) 10 MG tablet   Please advise.  Thanks, [employer]

## 2018-11-14 ENCOUNTER — Encounter: Payer: Self-pay | Admitting: Family Medicine

## 2019-03-04 ENCOUNTER — Other Ambulatory Visit: Payer: Self-pay | Admitting: Family Medicine

## 2019-03-04 MED ORDER — ESCITALOPRAM OXALATE 10 MG PO TABS: 10.0000 mg | ORAL_TABLET | Freq: Every day | ORAL | 1 refills | Status: DC

## 2019-03-04 NOTE — Telephone Encounter (Signed)
[facility] faxed refill request for the following medications:  escitalopram (LEXAPRO) 10 MG tablet   Please advise.  

## 2019-03-12 ENCOUNTER — Ambulatory Visit (INDEPENDENT_AMBULATORY_CARE_PROVIDER_SITE_OTHER): Payer: Medicare Other

## 2019-03-12 ENCOUNTER — Other Ambulatory Visit: Payer: Self-pay

## 2019-03-12 DIAGNOSIS — Z23 Encounter for immunization: Secondary | ICD-10-CM | POA: Diagnosis not present

## 2019-07-15 ENCOUNTER — Telehealth: Payer: Self-pay | Admitting: Family Medicine

## 2019-07-15 NOTE — Telephone Encounter (Signed)
Patient is calling to reschedule her AWV. Please [phone]

## 2019-07-15 NOTE — Telephone Encounter (Signed)
appt rescheduled.

## 2019-07-22 ENCOUNTER — Ambulatory Visit: Payer: Medicare Other

## 2019-07-22 ENCOUNTER — Ambulatory Visit: Payer: Medicare Other | Admitting: Family Medicine

## 2019-07-25 ENCOUNTER — Other Ambulatory Visit: Payer: Self-pay | Admitting: Family Medicine

## 2019-08-19 NOTE — Progress Notes (Signed)
Subjective:   Robin Stephens is a 69 y.o. female who presents for Medicare Annual (Subsequent) preventive examination.  Review of Systems:  N/A  Cardiac Risk Factors include: advanced age (>55men, >14 women)     Objective:     Vitals: BP 140/78 (BP Location: Right Arm)   Pulse 61   Temp 98.3 F (36.8 C) (Tympanic)   Ht 5\' 1"  (1.549 m)   Wt 108 lb 3.2 oz (49.1 kg)   BMI 20.44 kg/m   Body mass index is 20.44 kg/m.  Advanced Directives 08/20/2019 07/11/2018 07/11/2018  Does Patient Have a Medical Advance Directive? No No Yes  Type of Advance Directive - 09/09/2018;Living will  Does patient want to make changes to medical advance directive? - Yes (MAU/Ambulatory/Procedural Areas - Information given) -  Copy of Healthcare Power of Attorney in Chart? - - No - copy requested  Would patient like information on creating a medical advance directive? No - Patient declined - -    Tobacco Social History   Tobacco Use  Smoking Status Never Smoker  Smokeless Tobacco Never Used     Counseling given: Not Answered   Clinical Intake:  Pre-visit preparation completed: Yes  Pain : No/denies pain Pain Score: 0-No pain     Nutritional Status: BMI of 19-24  Normal Nutritional Risks: None Diabetes: No  How often do you need to have someone help you when you read instructions, pamphlets, or other written materials from your doctor or pharmacy?: 1 - Never  Interpreter Needed?: No  Information entered by :: [facility], LPN  History reviewed. No pertinent past medical history. Past Surgical History:  Procedure Laterality Date  . APPENDECTOMY  2006  . EYE SURGERY     PRK   Family History  Problem Relation Age of Onset  . Heart disease Father   . Obesity Sister   . Cancer Sister   . Breast cancer Sister 39       radiation; in remission  . Breast cancer Mother 11  . Healthy Sister    Social History   Socioeconomic History  . Marital status: Married      Spouse name: 54  . Number of children: 2  . Years of education: 44  . Highest education level: Some college, no degree  Occupational History  . Occupation: retired  Tobacco Use  . Smoking status: Never Smoker  . Smokeless tobacco: Never Used  Substance and Sexual Activity  . Alcohol use: Yes    Comment: wine occasionally/monthly  . Drug use: No  . Sexual activity: Not on file  Other Topics Concern  . Not on file  Social History Narrative  . Not on file   Social Determinants of Health   Financial Resource Strain: Low Risk   . Difficulty of Paying Living Expenses: Not hard at all  Food Insecurity: No Food Insecurity  . Worried About 18 in the Last Year: Never true  . Ran Out of Food in the Last Year: Never true  Transportation Needs: No Transportation Needs  . Lack of Transportation (Medical): No  . Lack of Transportation (Non-Medical): No  Physical Activity: Sufficiently Active  . Days of Exercise per Week: 7 days  . Minutes of Exercise per Session: 30 min  Stress: No Stress Concern Present  . Feeling of Stress : Not at all  Social Connections: Moderately Isolated  . Frequency of Communication with Friends and Family: Twice a week  . Frequency  of Social Gatherings with Friends and Family: Never  . Attends Religious Services: Never  . Active Member of Clubs or Organizations: No  . Attends Banker Meetings: Never  . Marital Status: Married    Outpatient Encounter Medications as of 08/20/2019  Medication Sig  . calcium carbonate (OS-CAL) 600 MG TABS tablet Take 1,200 mg by mouth daily with breakfast.   . Collagenase POWD by Does not apply route 2 (two) times daily. Unsure dose  . escitalopram (LEXAPRO) 10 MG tablet Take 1 tablet (10 mg total) by mouth daily.  . Ginger, Zingiber officinalis, 550 MG CAPS Take by mouth daily.   . Glucosamine-Chondroitin (GLUCOSAMINE CHONDR COMPLEX PO) Take by mouth daily. Unsure dose  . Lactobacillus  (ACIDOPHILUS) 100 MG CAPS Take by mouth daily.   . MULTIPLE VITAMINS PO Take by mouth daily.   . Omega-3 Fatty Acids (FISH OIL) 1200 MG CAPS Take by mouth daily.   . TURMERIC PO Take by mouth daily. Unsure dose  . hydrOXYzine (ATARAX/VISTARIL) 10 MG tablet Take 1 tablet (10 mg total) by mouth 3 (three) times daily as needed. (Patient not taking: Reported on 07/18/2018)  . vitamin C (ASCORBIC ACID) 500 MG tablet Take 500 mg by mouth daily.   . [DISCONTINUED] ASHWAGANDHA PO Take 460 mg by mouth.   . [DISCONTINUED] Garlic 1000 MG CAPS Take by mouth.   No facility-administered encounter medications on file as of 08/20/2019.    Activities of Daily Living In your present state of health, do you have any difficulty performing the following activities: 08/20/2019  Hearing? N  Vision? N  Difficulty concentrating or making decisions? N  Walking or climbing stairs? N  Dressing or bathing? N  Doing errands, shopping? N  Preparing Food and eating ? N  Using the Toilet? N  In the past six months, have you accidently leaked urine? N  Do you have problems with loss of bowel control? N  Managing your Medications? N  Managing your Finances? N  Housekeeping or managing your Housekeeping? N  Some recent data might be hidden    Patient Care Team: Beryle Flock Marzella Schlein, MD as PCP - General (Family Medicine) Pa, [facility]    Assessment:   This is a routine wellness examination for Robin Stephens.  Exercise Activities and Dietary recommendations Current Exercise Habits: Home exercise routine, Type of exercise: Other - see comments;walking;strength training/weights;stretching(eliptical), Time (Minutes): 60, Frequency (Times/Week): 7, Weekly Exercise (Minutes/Week): 420, Intensity: Moderate, Exercise limited by: None identified  Goals    . DIET - INCREASE WATER INTAKE     Recommend to drink at least 6-8 8oz glasses of water per day.       Fall Risk: Fall Risk  08/20/2019 07/11/2018 07/10/2017    Falls in the past year? 0 0 No  Number falls in past yr: 0 - -  Injury with Fall? 0 - -    FALL RISK PREVENTION PERTAINING TO THE HOME:  Any stairs in or around the home? Yes  If so, are there any without handrails? No   Home free of loose throw rugs in walkways, pet beds, electrical cords, etc? Yes  Adequate lighting in your home to reduce risk of falls? Yes   ASSISTIVE DEVICES UTILIZED TO PREVENT FALLS:  Life alert? No  Use of a cane, walker or w/c? No  Grab bars in the bathroom? No  Shower chair or bench in shower? No  Elevated toilet seat or a handicapped toilet? No  TIMED UP AND GO:  Was the test performed? No .    Depression Screen PHQ 2/9 Scores 08/20/2019 07/11/2018 07/10/2017 06/28/2016  PHQ - 2 Score 0 0 0 0     Cognitive Function: Declined today.          Immunization History  Administered Date(s) Administered  . Fluad Quad(high Dose 65+) 03/12/2019  . Influenza, High Dose Seasonal PF 03/06/2018  . Influenza,inj,Quad PF,6+ Mos 02/27/2017  . Pneumococcal Conjugate-13 07/10/2017  . Pneumococcal Polysaccharide-23 07/11/2018  . Td 10/17/2003  . Tdap 01/01/2013  . Zoster Recombinat (Shingrix) 12/05/2017, 02/06/2018    Qualifies for Shingles Vaccine? Completed series  Tdap: Up to date  Flu Vaccine: Up to date  Pneumococcal Vaccine: Completed series  Screening Tests Health Maintenance  Topic Date Due  . MAMMOGRAM  08/12/2020  . DEXA SCAN  08/09/2022  . TETANUS/TDAP  01/02/2023  . COLONOSCOPY  09/24/2024  . INFLUENZA VACCINE  Completed  . Hepatitis C Screening  Completed  . PNA vac Low Risk Adult  Completed    Cancer Screenings:  Colorectal Screening: Completed 09/25/14. Repeat every 10 years.   Mammogram: Completed 08/13/18. Repeat every 1-2 years as advised.   Bone Density: Completed 08/08/17. Results reflect OSTEOPENIA. Repeat every 5 years.   Lung Cancer Screening: (Low Dose CT Chest recommended if Age 51-80 years, 30 pack-year  currently smoking OR have quit w/in 15years.) does not qualify.   Additional Screening:  Hepatitis C Screening: Up to date  Vision Screening: Recommended annual ophthalmology exams for early detection of glaucoma and other disorders of the eye.  Dental Screening: Recommended annual dental exams for proper oral hygiene  Community Resource Referral:  CRR required this visit?  No       Plan:  I have personally reviewed and addressed the Medicare Annual Wellness questionnaire and have noted the following in the patient's chart:  A. Medical and social history B. Use of alcohol, tobacco or illicit drugs  C. Current medications and supplements D. Functional ability and status E.  Nutritional status F.  Physical activity G. Advance directives H. List of other physicians I.  Hospitalizations, surgeries, and ER visits in previous 12 months J.  [place] such as hearing and vision if needed, cognitive and depression L. Referrals and appointments   In addition, I have reviewed and discussed with patient certain preventive protocols, quality metrics, and best practice recommendations. A written personalized care plan for preventive services as well as general preventive health recommendations were provided to patient. Nurse Health Advisor  Signed,    Ilda Laskin [place], [place]  [id number] Nurse Health Advisor   Nurse Notes: None.

## 2019-08-20 ENCOUNTER — Other Ambulatory Visit: Payer: Self-pay

## 2019-08-20 ENCOUNTER — Encounter: Payer: Self-pay | Admitting: Family Medicine

## 2019-08-20 ENCOUNTER — Ambulatory Visit (INDEPENDENT_AMBULATORY_CARE_PROVIDER_SITE_OTHER): Payer: Medicare PPO

## 2019-08-20 ENCOUNTER — Ambulatory Visit (INDEPENDENT_AMBULATORY_CARE_PROVIDER_SITE_OTHER): Payer: Medicare PPO | Admitting: Family Medicine

## 2019-08-20 VITALS — BP 130/65 | HR 61 | Temp 98.3°F | Ht 61.0 in | Wt 108.0 lb

## 2019-08-20 VITALS — BP 140/78 | HR 61 | Temp 98.3°F | Ht 61.0 in | Wt 108.2 lb

## 2019-08-20 DIAGNOSIS — F5104 Psychophysiologic insomnia: Secondary | ICD-10-CM | POA: Diagnosis not present

## 2019-08-20 DIAGNOSIS — F419 Anxiety disorder, unspecified: Secondary | ICD-10-CM | POA: Diagnosis not present

## 2019-08-20 DIAGNOSIS — Z0000 Encounter for general adult medical examination without abnormal findings: Secondary | ICD-10-CM | POA: Diagnosis not present

## 2019-08-20 DIAGNOSIS — E7800 Pure hypercholesterolemia, unspecified: Secondary | ICD-10-CM | POA: Diagnosis not present

## 2019-08-20 DIAGNOSIS — Z1231 Encounter for screening mammogram for malignant neoplasm of breast: Secondary | ICD-10-CM

## 2019-08-20 NOTE — Assessment & Plan Note (Signed)
Recheck CMP and FLP Not on a statin No h/o CVA or heart disease

## 2019-08-20 NOTE — Patient Instructions (Addendum)
Preventive Care 67 Years and Older, Female Preventive care refers to lifestyle choices and visits with your health care provider that can promote health and wellness. This includes:  A yearly physical exam. This is also called an annual well check.  Regular dental and eye exams.  Immunizations.  Screening for certain conditions.  Healthy lifestyle choices, such as diet and exercise. What can I expect for my preventive care visit? Physical exam Your health care provider will check:  Height and weight. These may be used to calculate body mass index (BMI), which is a measurement that tells if you are at a healthy weight.  Heart rate and blood pressure.  Your skin for abnormal spots. Counseling Your health care provider may ask you questions about:  Alcohol, tobacco, and drug use.  Emotional well-being.  Home and relationship well-being.  Sexual activity.  Eating habits.  History of falls.  Memory and ability to understand (cognition).  Work and work Statistician.  Pregnancy and menstrual history. What immunizations do I need?  Influenza (flu) vaccine  This is recommended every year. Tetanus, diphtheria, and pertussis (Tdap) vaccine  You may need a Td booster every 10 years. Varicella (chickenpox) vaccine  You may need this vaccine if you have not already been vaccinated. Zoster (shingles) vaccine  You may need this after age 33. Pneumococcal conjugate (PCV13) vaccine  One dose is recommended after age 33. Pneumococcal polysaccharide (PPSV23) vaccine  One dose is recommended after age 72. Measles, mumps, and rubella (MMR) vaccine  You may need at least one dose of MMR if you were born in 1957 or later. You may also need a second dose. Meningococcal conjugate (MenACWY) vaccine  You may need this if you have certain conditions. Hepatitis A vaccine  You may need this if you have certain conditions or if you travel or work in places where you may be exposed  to hepatitis A. Hepatitis B vaccine  You may need this if you have certain conditions or if you travel or work in places where you may be exposed to hepatitis B. Haemophilus influenzae type b (Hib) vaccine  You may need this if you have certain conditions. You may receive vaccines as individual doses or as more than one vaccine together in one shot (combination vaccines). Talk with your health care provider about the risks and benefits of combination vaccines. What tests do I need? Blood tests  Lipid and cholesterol levels. These may be checked every 5 years, or more frequently depending on your overall health.  Hepatitis C test.  Hepatitis B test. Screening  Lung cancer screening. You may have this screening every year starting at age 39 if you have a 30-pack-year history of smoking and currently smoke or have quit within the past 15 years.  Colorectal cancer screening. All adults should have this screening starting at age 36 and continuing until age 15. Your health care provider may recommend screening at age 23 if you are at increased risk. You will have tests every 1-10 years, depending on your results and the type of screening test.  Diabetes screening. This is done by checking your blood sugar (glucose) after you have not eaten for a while (fasting). You may have this done every 1-3 years.  Mammogram. This may be done every 1-2 years. Talk with your health care provider about how often you should have regular mammograms.  BRCA-related cancer screening. This may be done if you have a family history of breast, ovarian, tubal, or peritoneal cancers.  Other tests  Sexually transmitted disease (STD) testing.  Bone density scan. This is done to screen for osteoporosis. You may have this done starting at age 65. Follow these instructions at home: Eating and drinking  Eat a diet that includes fresh fruits and vegetables, whole grains, lean protein, and low-fat dairy products. Limit  your intake of foods with high amounts of sugar, saturated fats, and salt.  Take vitamin and mineral supplements as recommended by your health care provider.  Do not drink alcohol if your health care provider tells you not to drink.  If you drink alcohol: ? Limit how much you have to 0-1 drink a day. ? Be aware of how much alcohol is in your drink. In the [place] one drink equals one 12 oz bottle of beer (355 mL), one 5 oz glass of wine (148 mL), or one 1 oz glass of hard liquor (44 mL). Lifestyle  Take daily care of your teeth and gums.  Stay active. Exercise for at least 30 minutes on 5 or more days each week.  Do not use any products that contain nicotine or tobacco, such as cigarettes, e-cigarettes, and chewing tobacco. If you need help quitting, ask your health care provider.  If you are sexually active, practice safe sex. Use a condom or other form of protection in order to prevent STIs (sexually transmitted infections).  Talk with your health care provider about taking a low-dose aspirin or statin. What's next?  Go to your health care provider once a year for a well check visit.  Ask your health care provider how often you should have your eyes and teeth checked.  Stay up to date on all vaccines. This information is not intended to replace advice given to you by your health care provider. Make sure you discuss any questions you have with your health care provider. Document Revised: 05/10/2018 Document Reviewed: 05/10/2018 Elsevier Patient Education  2020 Elsevier Inc.  

## 2019-08-20 NOTE — Assessment & Plan Note (Signed)
Stable and well controlled Continue Lexapro

## 2019-08-20 NOTE — Patient Instructions (Signed)
Robin Stephens , Thank you for taking time to come for your Medicare Wellness Visit. I appreciate your ongoing commitment to your health goals. Please review the following plan we discussed and let me know if I can assist you in the future.   Screening recommendations/referrals: Colonoscopy: Up to date, due 08/2024 Mammogram: Up to date, due 07/2020 Bone Density: Up to date, due 07/2022 Recommended yearly ophthalmology/optometry visit for glaucoma screening and checkup Recommended yearly dental visit for hygiene and checkup  Vaccinations: Influenza vaccine: Up to date Pneumococcal vaccine: Completed series Tdap vaccine: Up to date, due 12/2022 Shingles vaccine: Completed series    Advanced directives: Advance directive discussed with you today. Even though you declined this today please call our office should you change your mind and we can give you the proper paperwork for you to fill out.  Conditions/risks identified: Recommend increasing water intake to 6-8 8 oz glasses a day.   Next appointment: 10:40 AM with Dr Beryle Flock    Preventive Care 67 Years and Older, Female Preventive care refers to lifestyle choices and visits with your health care provider that can promote health and wellness. What does preventive care include?  A yearly physical exam. This is also called an annual well check.  Dental exams once or twice a year.  Routine eye exams. Ask your health care provider how often you should have your eyes checked.  Personal lifestyle choices, including:  Daily care of your teeth and gums.  Regular physical activity.  Eating a healthy diet.  Avoiding tobacco and drug use.  Limiting alcohol use.  Practicing safe sex.  Taking low-dose aspirin every day.  Taking vitamin and mineral supplements as recommended by your health care provider. What happens during an annual well check? The services and screenings done by your health care provider during your annual well check  will depend on your age, overall health, lifestyle risk factors, and family history of disease. Counseling  Your health care provider may ask you questions about your:  Alcohol use.  Tobacco use.  Drug use.  Emotional well-being.  Home and relationship well-being.  Sexual activity.  Eating habits.  History of falls.  Memory and ability to understand (cognition).  Work and work Astronomer.  Reproductive health. Screening  You may have the following tests or measurements:  Height, weight, and BMI.  Blood pressure.  Lipid and cholesterol levels. These may be checked every 5 years, or more frequently if you are over 37 years old.  Skin check.  Lung cancer screening. You may have this screening every year starting at age 52 if you have a 30-pack-year history of smoking and currently smoke or have quit within the past 15 years.  Fecal occult blood test (FOBT) of the stool. You may have this test every year starting at age 66.  Flexible sigmoidoscopy or colonoscopy. You may have a sigmoidoscopy every 5 years or a colonoscopy every 10 years starting at age 62.  Hepatitis C blood test.  Hepatitis B blood test.  Sexually transmitted disease (STD) testing.  Diabetes screening. This is done by checking your blood sugar (glucose) after you have not eaten for a while (fasting). You may have this done every 1-3 years.  Bone density scan. This is done to screen for osteoporosis. You may have this done starting at age 55.  Mammogram. This may be done every 1-2 years. Talk to your health care provider about how often you should have regular mammograms. Talk with your health care provider  about your test results, treatment options, and if necessary, the need for more tests. Vaccines  Your health care provider may recommend certain vaccines, such as:  Influenza vaccine. This is recommended every year.  Tetanus, diphtheria, and acellular pertussis (Tdap, Td) vaccine. You may  need a Td booster every 10 years.  Zoster vaccine. You may need this after age 1.  Pneumococcal 13-valent conjugate (PCV13) vaccine. One dose is recommended after age 14.  Pneumococcal polysaccharide (PPSV23) vaccine. One dose is recommended after age 13. Talk to your health care provider about which screenings and vaccines you need and how often you need them. This information is not intended to replace advice given to you by your health care provider. Make sure you discuss any questions you have with your health care provider. Document Released: 06/12/2015 Document Revised: 02/03/2016 Document Reviewed: 03/17/2015 Elsevier Interactive Patient Education  2017 [employer] Prevention in the Home Falls can cause injuries. They can happen to people of all ages. There are many things you can do to make your home safe and to help prevent falls. What can I do on the outside of my home?  Regularly fix the edges of walkways and driveways and fix any cracks.  Remove anything that might make you trip as you walk through a door, such as a raised step or threshold.  Trim any bushes or trees on the path to your home.  Use bright outdoor lighting.  Clear any walking paths of anything that might make someone trip, such as rocks or tools.  Regularly check to see if handrails are loose or broken. Make sure that both sides of any steps have handrails.  Any raised decks and porches should have guardrails on the edges.  Have any leaves, snow, or ice cleared regularly.  Use sand or salt on walking paths during winter.  Clean up any spills in your garage right away. This includes oil or grease spills. What can I do in the bathroom?  Use night lights.  Install grab bars by the toilet and in the tub and shower. Do not use towel bars as grab bars.  Use non-skid mats or decals in the tub or shower.  If you need to sit down in the shower, use a plastic, non-slip stool.  Keep the floor  dry. Clean up any water that spills on the floor as soon as it happens.  Remove soap buildup in the tub or shower regularly.  Attach bath mats securely with double-sided non-slip rug tape.  Do not have throw rugs and other things on the floor that can make you trip. What can I do in the bedroom?  Use night lights.  Make sure that you have a light by your bed that is easy to reach.  Do not use any sheets or blankets that are too big for your bed. They should not hang down onto the floor.  Have a firm chair that has side arms. You can use this for support while you get dressed.  Do not have throw rugs and other things on the floor that can make you trip. What can I do in the kitchen?  Clean up any spills right away.  Avoid walking on wet floors.  Keep items that you use a lot in easy-to-reach places.  If you need to reach something above you, use a strong step stool that has a grab bar.  Keep electrical cords out of the way.  Do not use floor polish or  wax that makes floors slippery. If you must use wax, use non-skid floor wax.  Do not have throw rugs and other things on the floor that can make you trip. What can I do with my stairs?  Do not leave any items on the stairs.  Make sure that there are handrails on both sides of the stairs and use them. Fix handrails that are broken or loose. Make sure that handrails are as long as the stairways.  Check any carpeting to make sure that it is firmly attached to the stairs. Fix any carpet that is loose or worn.  Avoid having throw rugs at the top or bottom of the stairs. If you do have throw rugs, attach them to the floor with carpet tape.  Make sure that you have a light switch at the top of the stairs and the bottom of the stairs. If you do not have them, ask someone to add them for you. What else can I do to help prevent falls?  Wear shoes that:  Do not have high heels.  Have rubber bottoms.  Are comfortable and fit you  well.  Are closed at the toe. Do not wear sandals.  If you use a stepladder:  Make sure that it is fully opened. Do not climb a closed stepladder.  Make sure that both sides of the stepladder are locked into place.  Ask someone to hold it for you, if possible.  Clearly mark and make sure that you can see:  Any grab bars or handrails.  First and last steps.  Where the edge of each step is.  Use tools that help you move around (mobility aids) if they are needed. These include:  Canes.  Walkers.  Scooters.  Crutches.  Turn on the lights when you go into a dark area. Replace any light bulbs as soon as they burn out.  Set up your furniture so you have a clear path. Avoid moving your furniture around.  If any of your floors are uneven, fix them.  If there are any pets around you, be aware of where they are.  Review your medicines with your doctor. Some medicines can make you feel dizzy. This can increase your chance of falling. Ask your doctor what other things that you can do to help prevent falls. This information is not intended to replace advice given to you by your health care provider. Make sure you discuss any questions you have with your health care provider. Document Released: 03/12/2009 Document Revised: 10/22/2015 Document Reviewed: 06/20/2014 Elsevier Interactive Patient Education  2017 [employer].

## 2019-08-20 NOTE — Progress Notes (Signed)
Patient: Robin Stephens, Female    DOB: 09-03-1950, 69 y.o.   MRN: [id number] Visit Date: 08/20/2019  Today's Provider: Shirlee Latch, MD   Chief Complaint  Patient presents with  . Annual Exam   Subjective:     Complete Physical Robin Stephens is a 69 y.o. female. She feels well. She reports exercising regularly. She reports she is sleeping well.  -----------------------------------------------------------   Review of Systems  Constitutional: Negative.   HENT: Negative.   Eyes: Negative.   Respiratory: Negative.   Cardiovascular: Negative.   Gastrointestinal: Negative.   Endocrine: Negative.   Genitourinary: Negative.   Musculoskeletal: Negative.   Allergic/Immunologic: Negative.   Neurological: Negative.   Hematological: Negative.   Psychiatric/Behavioral: Negative.     Social History   Socioeconomic History  . Marital status: Married    Spouse name: Clide Cliff  . Number of children: 2  . Years of education: 41  . Highest education level: Some college, no degree  Occupational History  . Occupation: retired  Tobacco Use  . Smoking status: Never Smoker  . Smokeless tobacco: Never Used  Substance and Sexual Activity  . Alcohol use: Yes    Comment: wine occasionally/monthly  . Drug use: No  . Sexual activity: Not on file  Other Topics Concern  . Not on file  Social History Narrative  . Not on file   Social Determinants of Health   Financial Resource Strain: Low Risk   . Difficulty of Paying Living Expenses: Not hard at all  Food Insecurity: No Food Insecurity  . Worried About Programme researcher, broadcasting/film/video in the Last Year: Never true  . Ran Out of Food in the Last Year: Never true  Transportation Needs: No Transportation Needs  . Lack of Transportation (Medical): No  . Lack of Transportation (Non-Medical): No  Physical Activity: Sufficiently Active  . Days of Exercise per Week: 7 days  . Minutes of Exercise per Session: 30 min  Stress: No Stress  Concern Present  . Feeling of Stress : Not at all  Social Connections: Moderately Isolated  . Frequency of Communication with Friends and Family: Twice a week  . Frequency of Social Gatherings with Friends and Family: Never  . Attends Religious Services: Never  . Active Member of Clubs or Organizations: No  . Attends Banker Meetings: Never  . Marital Status: Married  Catering manager Violence: Not At Risk  . Fear of Current or Ex-Partner: No  . Emotionally Abused: No  . Physically Abused: No  . Sexually Abused: No    No past medical history on file.   Patient Active Problem List   Diagnosis Date Noted  . Anxiety 01/21/2015  . 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 Hypercholesteremia 01/21/2015  . Insomnia 01/21/2015    Past Surgical History:  Procedure Laterality Date  . APPENDECTOMY  2006  . EYE SURGERY     PRK    Her family history includes Breast cancer (age of onset: 77) in her mother; Breast cancer (age of onset: 79) in her sister; Cancer in her sister; Healthy in her sister; Heart disease in her father; Obesity in her sister.   Current Outpatient Medications:  .  calcium carbonate (OS-CAL) 600 MG TABS tablet, Take 1,200 mg by mouth daily with breakfast. , Disp: , Rfl:  .  Collagenase POWD, by Does not apply route 2 (two) times daily. Unsure dose, Disp: , Rfl:  .  escitalopram (LEXAPRO) 10 MG tablet,  Take 1 tablet (10 mg total) by mouth daily., Disp: 90 tablet, Rfl: 1 .  Ginger, Zingiber officinalis, 550 MG CAPS, Take by mouth daily. , Disp: , Rfl:  .  Glucosamine-Chondroitin (GLUCOSAMINE CHONDR COMPLEX PO), Take by mouth daily. Unsure dose, Disp: , Rfl:  .  Lactobacillus (ACIDOPHILUS) 100 MG CAPS, Take by mouth daily. , Disp: , Rfl:  .  MULTIPLE VITAMINS PO, Take by mouth daily. , Disp: , Rfl:  .  Omega-3 Fatty Acids (FISH OIL) 1200 MG CAPS, Take by mouth daily. , Disp: , Rfl:  .  TURMERIC PO, Take by mouth daily. Unsure dose, Disp: , Rfl:  .   vitamin C (ASCORBIC ACID) 500 MG tablet, Take 500 mg by mouth daily. , Disp: , Rfl:  .  hydrOXYzine (ATARAX/VISTARIL) 10 MG tablet, Take 1 tablet (10 mg total) by mouth 3 (three) times daily as needed. (Patient not taking: Reported on 07/18/2018), Disp: 30 tablet, Rfl: 2  Patient Care Team: Virginia Crews, MD as PCP - General (Family Medicine) Pa, [place] [facility]     Objective:    Vitals: BP 130/65   Pulse 61   Temp 98.3 F (36.8 C) (Temporal)   Ht 5\' 1"  (1.549 m)   Wt 108 lb (49 kg)   BMI 20.41 kg/m   Physical Exam Vitals reviewed.  Constitutional:      General: She is not in acute distress.    Appearance: Normal appearance. She is well-developed. She is not diaphoretic.  HENT:     Head: Normocephalic and atraumatic.     Right Ear: Tympanic membrane, ear canal and external ear normal.     Left Ear: Tympanic membrane, ear canal and external ear normal.  Eyes:     General: No scleral icterus.    Conjunctiva/sclera: Conjunctivae normal.     Pupils: Pupils are equal, round, and reactive to light.  Neck:     Thyroid: No thyromegaly.  Cardiovascular:     Rate and Rhythm: Normal rate and regular rhythm.     Heart sounds: Normal heart sounds. No murmur.  Pulmonary:     Effort: Pulmonary effort is normal. No respiratory distress.     Breath sounds: Normal breath sounds. No wheezing or rales.  Abdominal:     General: There is no distension.     Palpations: Abdomen is soft.     Tenderness: There is no abdominal tenderness.  Musculoskeletal:        General: No deformity.     Cervical back: Neck supple.     Right lower leg: No edema.     Left lower leg: No edema.  Lymphadenopathy:     Cervical: No cervical adenopathy.  Skin:    General: Skin is warm and dry.     Findings: No rash.  Neurological:     Mental Status: She is alert and oriented to person, place, and time. Mental status is at baseline.  Psychiatric:        Mood and Affect: Mood normal.         Behavior: Behavior normal.        Thought Content: Thought content normal.     Activities of Daily Living In your present state of health, do you have any difficulty performing the following activities: 08/20/2019  Hearing? N  Vision? N  Difficulty concentrating or making decisions? N  Walking or climbing stairs? N  Dressing or bathing? N  Doing errands, shopping? N  Preparing Food and eating ?  N  Using the Toilet? N  In the past six months, have you accidently leaked urine? N  Do you have problems with loss of bowel control? N  Managing your Medications? N  Managing your Finances? N  Housekeeping or managing your Housekeeping? N  Some recent data might be hidden    Fall Risk Assessment Fall Risk  08/20/2019 07/11/2018 07/10/2017  Falls in the past year? 0 0 No  Number falls in past yr: 0 - -  Injury with Fall? 0 - -     Depression Screen PHQ 2/9 Scores 08/20/2019 07/11/2018 07/10/2017 06/28/2016  PHQ - 2 Score 0 0 0 0    No flowsheet data found.     Assessment & Plan:    Annual Physical Reviewed patient's Family Medical History Reviewed and updated list of patient's medical providers Assessment of cognitive impairment was done Assessed patient's functional ability Established a written schedule for health screening services Health Risk Assessent Completed and Reviewed  Exercise Activities and Dietary recommendations Goals    . DIET - INCREASE WATER INTAKE     Recommend to drink at least 6-8 8oz glasses of water per day.       Immunization History  Administered Date(s) Administered  . Fluad Quad(high Dose 65+) 03/12/2019  . Influenza, High Dose Seasonal PF 03/06/2018  . Influenza,inj,Quad PF,6+ Mos 02/27/2017  . Pneumococcal Conjugate-13 07/10/2017  . Pneumococcal Polysaccharide-23 07/11/2018  . Td 10/17/2003  . Tdap 01/01/2013  . Zoster Recombinat (Shingrix) 12/05/2017, 02/06/2018    Health Maintenance  Topic Date Due  . MAMMOGRAM  08/12/2020  . DEXA  SCAN  08/09/2022  . TETANUS/TDAP  01/02/2023  . COLONOSCOPY  09/24/2024  . INFLUENZA VACCINE  Completed  . Hepatitis C Screening  Completed  . PNA vac Low Risk Adult  Completed     Discussed health benefits of physical activity, and encouraged her to engage in regular exercise appropriate for her age and condition.    ------------------------------------------------------------------------------------------------------------  Problem List Items Addressed This Visit      Other   Anxiety    Stable and well controlled Continue Lexapro      Hypercholesteremia    Recheck CMP and FLP Not on a statin No h/o CVA or heart disease      Relevant Orders   Comprehensive metabolic panel   Lipid panel   CBC   Insomnia    Well controlled 2/2 anxiety Continue Lexapro       Other Visit Diagnoses    Encounter for annual physical exam    -  Primary   Relevant Orders   Comprehensive metabolic panel   Lipid panel   CBC   Breast cancer screening by mammogram       Relevant Orders   MM 3D SCREEN BREAST BILATERAL       Return in about 1 year (around 08/19/2020) for CPE/AWV.   The entirety of the information documented in the History of Present Illness, Review of Systems and Physical Exam were personally obtained by me. Portions of this information were initially documented by Kavin Leech, CMA and reviewed by me for thoroughness and accuracy.    Makylee Sanborn, Marzella Schlein, MD MPH [facility]

## 2019-08-20 NOTE — Assessment & Plan Note (Signed)
Well controlled 2/2 anxiety Continue Lexapro

## 2019-08-21 ENCOUNTER — Telehealth: Payer: Self-pay

## 2019-08-21 LAB — COMPREHENSIVE METABOLIC PANEL
ALT: 16 IU/L (ref 0–32)
AST: 14 IU/L (ref 0–40)
Albumin/Globulin Ratio: 1.8 (ref 1.2–2.2)
Albumin: 4.3 g/dL (ref 3.8–4.8)
Alkaline Phosphatase: 56 IU/L (ref 39–117)
BUN/Creatinine Ratio: 34 — ABNORMAL HIGH (ref 12–28)
BUN: 24 mg/dL (ref 8–27)
Bilirubin Total: 0.4 mg/dL (ref 0.0–1.2)
CO2: 24 mmol/L (ref 20–29)
Calcium: 9.2 mg/dL (ref 8.7–10.3)
Chloride: 103 mmol/L (ref 96–106)
Creatinine, Ser: 0.7 mg/dL (ref 0.57–1.00)
GFR calc Af Amer: 104 mL/min/{1.73_m2} (ref >59)
GFR calc non Af Amer: 90 mL/min/{1.73_m2} (ref >59)
Globulin, Total: 2.4 g/dL (ref 1.5–4.5)
Glucose: 94 mg/dL (ref 65–99)
Potassium: 4.4 mmol/L (ref 3.5–5.2)
Sodium: 140 mmol/L (ref 134–144)
Total Protein: 6.7 g/dL (ref 6.0–8.5)

## 2019-08-21 LAB — CBC
Hematocrit: 41.8 % (ref 34.0–46.6)
Hemoglobin: 13.5 g/dL (ref 11.1–15.9)
MCH: 30.9 pg (ref 26.6–33.0)
MCHC: 32.3 g/dL (ref 31.5–35.7)
MCV: 96 fL (ref 79–97)
Platelets: 215 10*3/uL (ref 150–450)
RBC: 4.37 x10E6/uL (ref 3.77–5.28)
RDW: 11.9 % (ref 11.7–15.4)
WBC: 6.3 10*3/uL (ref 3.4–10.8)

## 2019-08-21 LAB — LIPID PANEL
Chol/HDL Ratio: 3.3 ratio (ref 0.0–4.4)
Cholesterol, Total: 244 mg/dL — ABNORMAL HIGH (ref 100–199)
HDL: 74 mg/dL (ref >39)
LDL Chol Calc (NIH): 157 mg/dL — ABNORMAL HIGH (ref 0–99)
Triglycerides: 79 mg/dL (ref 0–149)
VLDL Cholesterol Cal: 13 mg/dL (ref 5–40)

## 2019-08-21 NOTE — Telephone Encounter (Signed)
-----   Message from Erasmo Downer, MD sent at 08/21/2019  8:31 AM EDT ----- Normal/stable labs

## 2019-08-21 NOTE — Telephone Encounter (Signed)
Seen by patient Robin Stephens on 08/21/2019 8:43 AM EDT

## 2019-08-26 ENCOUNTER — Other Ambulatory Visit: Payer: Self-pay | Admitting: Family Medicine

## 2019-08-26 MED ORDER — ESCITALOPRAM OXALATE 10 MG PO TABS: 10.0000 mg | ORAL_TABLET | Freq: Every day | ORAL | 1 refills | Status: DC

## 2019-08-26 NOTE — Telephone Encounter (Signed)
[facility] faxed refill request for the following medications:  escitalopram (LEXAPRO) 10 MG tablet   Please advise.  

## 2019-09-23 ENCOUNTER — Ambulatory Visit
Admission: RE | Admit: 2019-09-23 | Discharge: 2019-09-23 | Disposition: A | Discharge status: Home / Self Care | Payer: Medicare PPO | Source: Ambulatory Visit | Attending: Family Medicine | Admitting: Family Medicine

## 2019-09-23 DIAGNOSIS — Z1231 Encounter for screening mammogram for malignant neoplasm of breast: Secondary | ICD-10-CM | POA: Diagnosis not present

## 2019-09-24 ENCOUNTER — Telehealth: Payer: Self-pay

## 2019-09-24 NOTE — Telephone Encounter (Signed)
-----   Message from Erasmo Downer, MD sent at 09/24/2019  9:40 AM EDT ----- Normal mammogram. Repeat in 1 yr

## 2019-09-24 NOTE — Telephone Encounter (Signed)
Patient advised.

## 2020-02-28 ENCOUNTER — Encounter: Payer: Self-pay | Admitting: Family Medicine

## 2020-03-03 MED ORDER — ESCITALOPRAM OXALATE 10 MG PO TABS: 10.0000 mg | ORAL_TABLET | Freq: Every day | ORAL | 1 refills | Status: DC

## 2020-06-11 DIAGNOSIS — H2513 Age-related nuclear cataract, bilateral: Secondary | ICD-10-CM | POA: Diagnosis not present

## 2020-06-29 IMAGING — MG DIGITAL SCREENING BILAT W/ TOMO W/ CAD
8 series · 9 of 24 positions shown · non-contrast
Comparison: Previous exam(s).

CLINICAL DATA: Screening.

EXAM:
DIGITAL SCREENING BILATERAL MAMMOGRAM WITH TOMO AND CAD

[R MLO synth-2D]
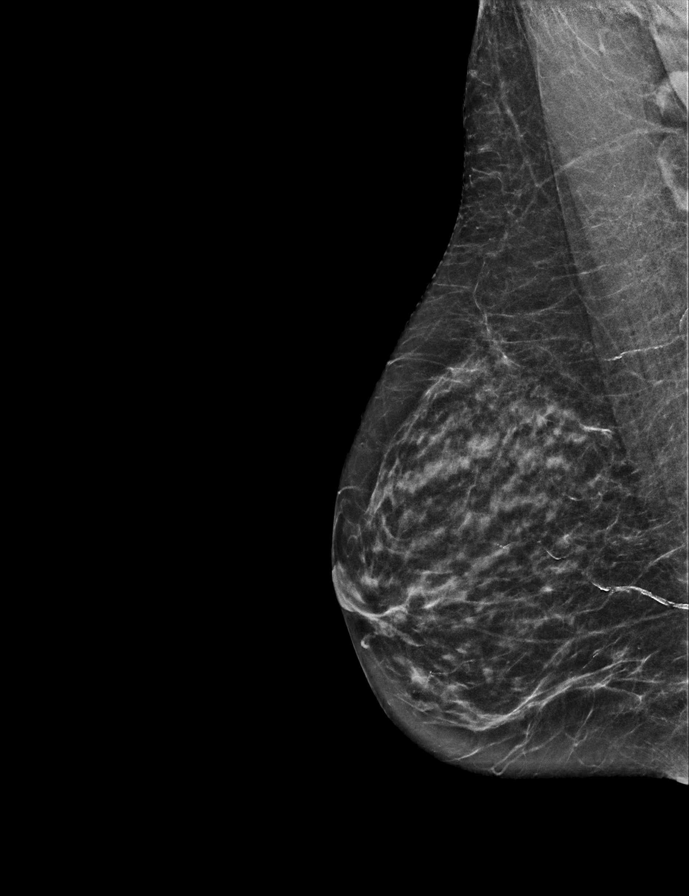

[L MLO synth-2D]
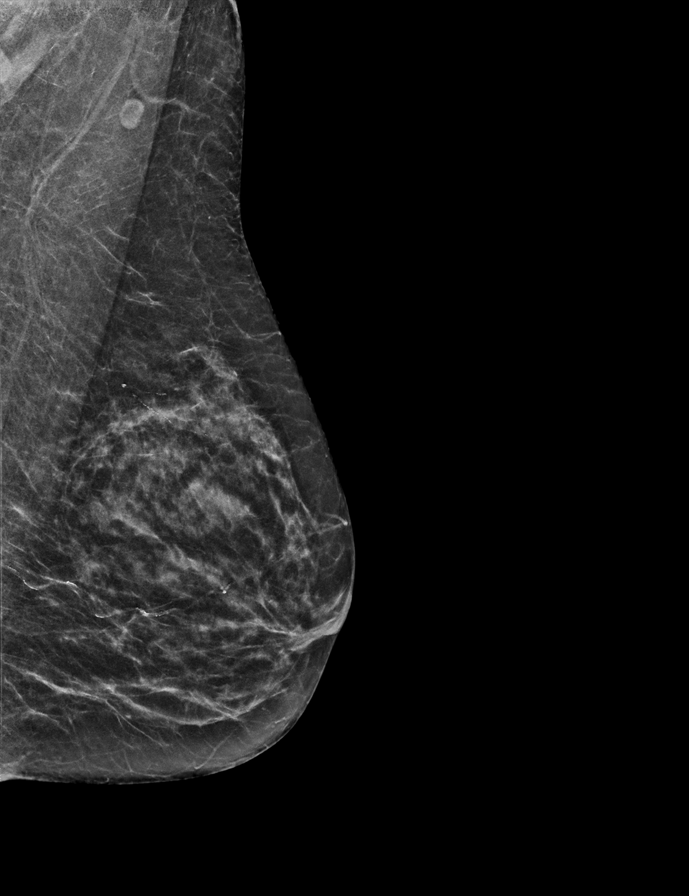

[L CC synth-2D]
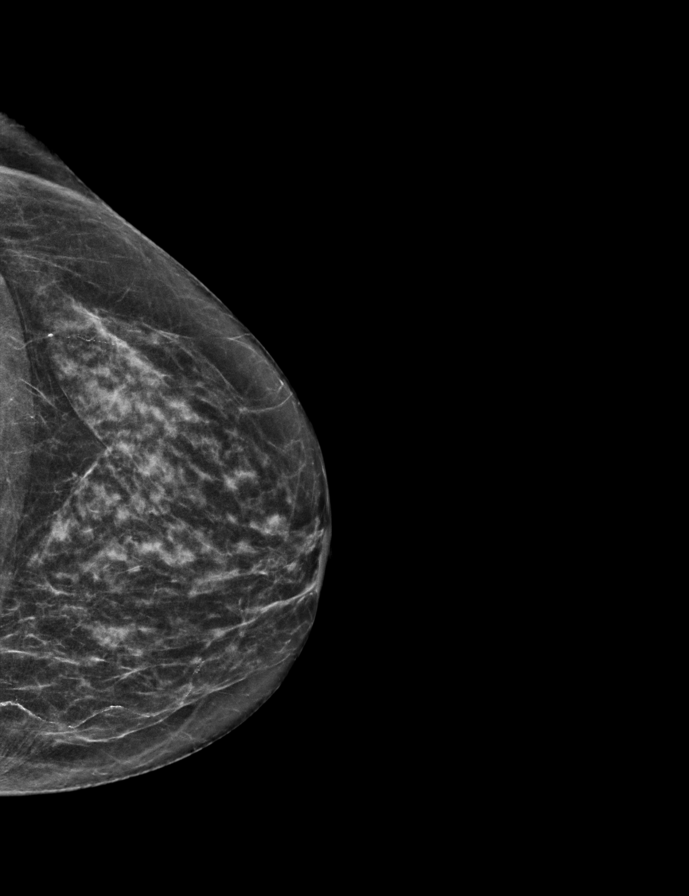

[R CC synth-2D]
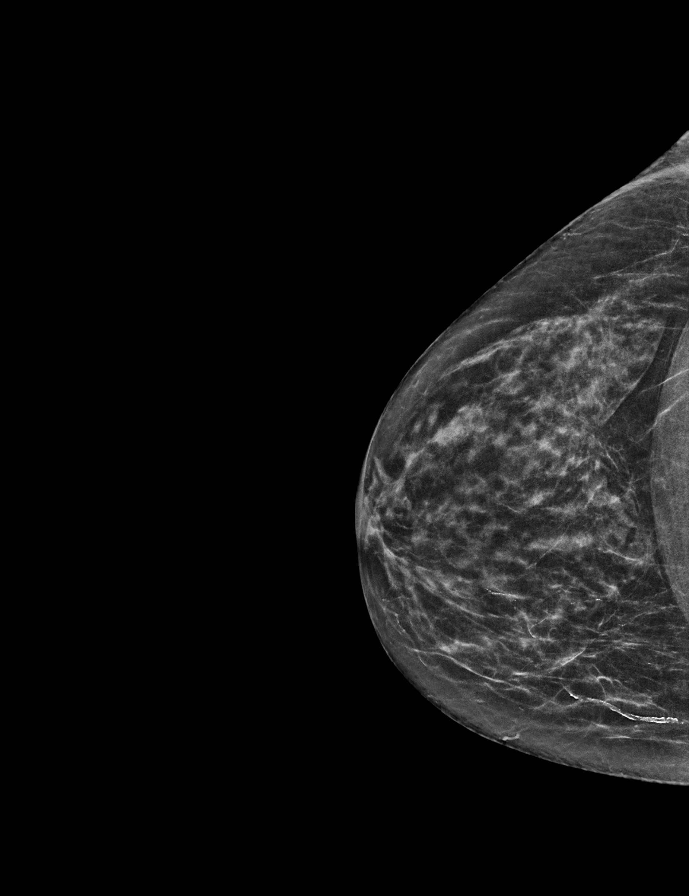

[R CC tomo · 2 of 48 frames shown]
[frame 16/48]
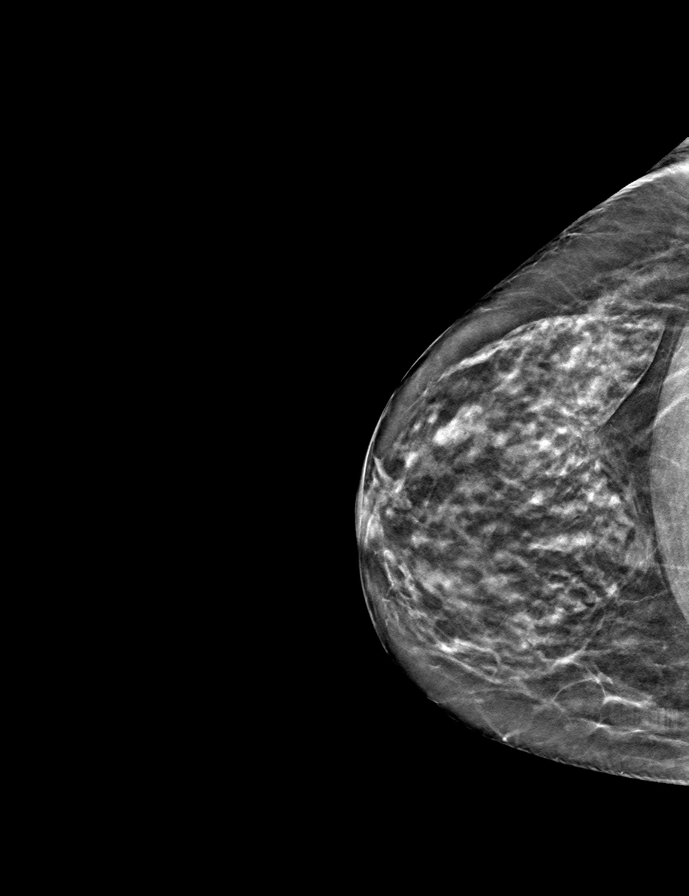
[frame 25/48]
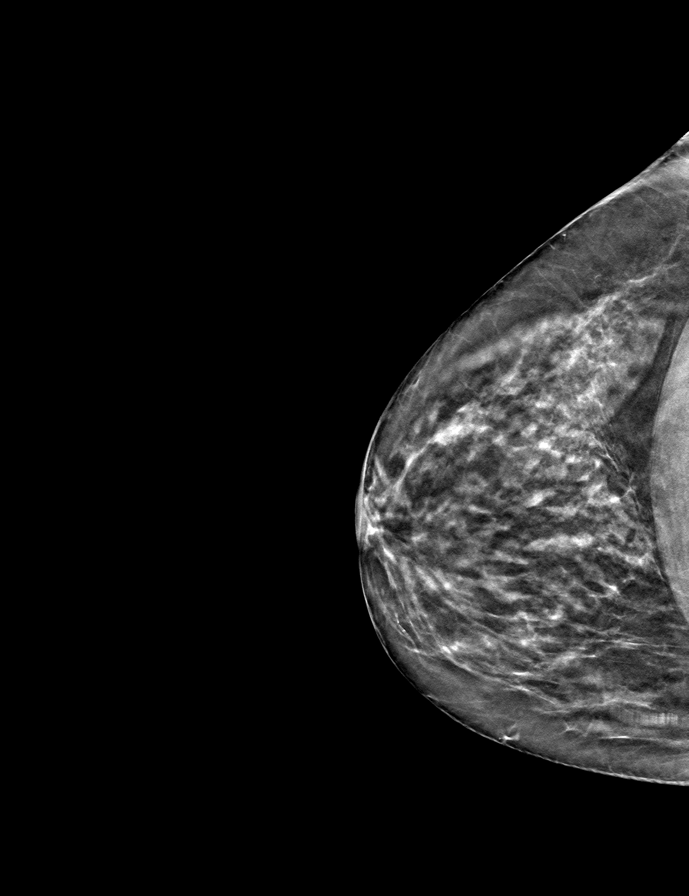

[R MLO tomo · tomo slice 26/51.0]
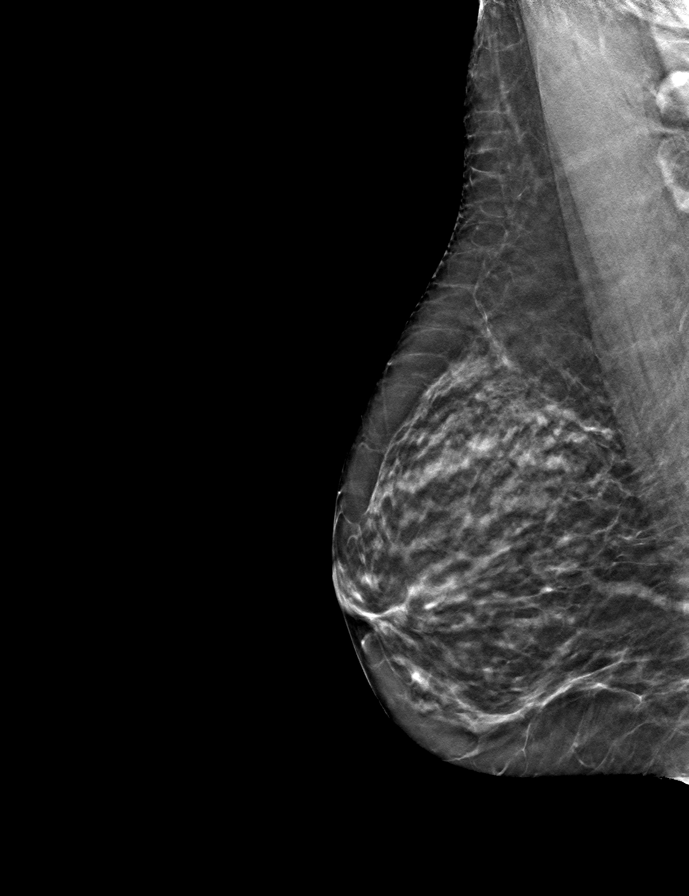

[L CC tomo · tomo slice 24/47.0]
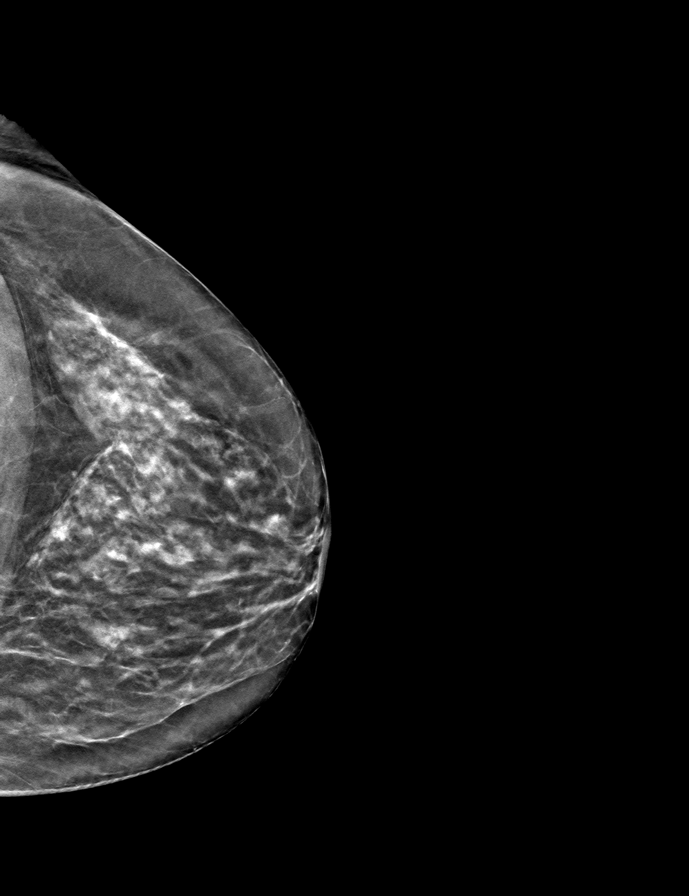

[L MLO tomo · tomo slice 24/47.0]
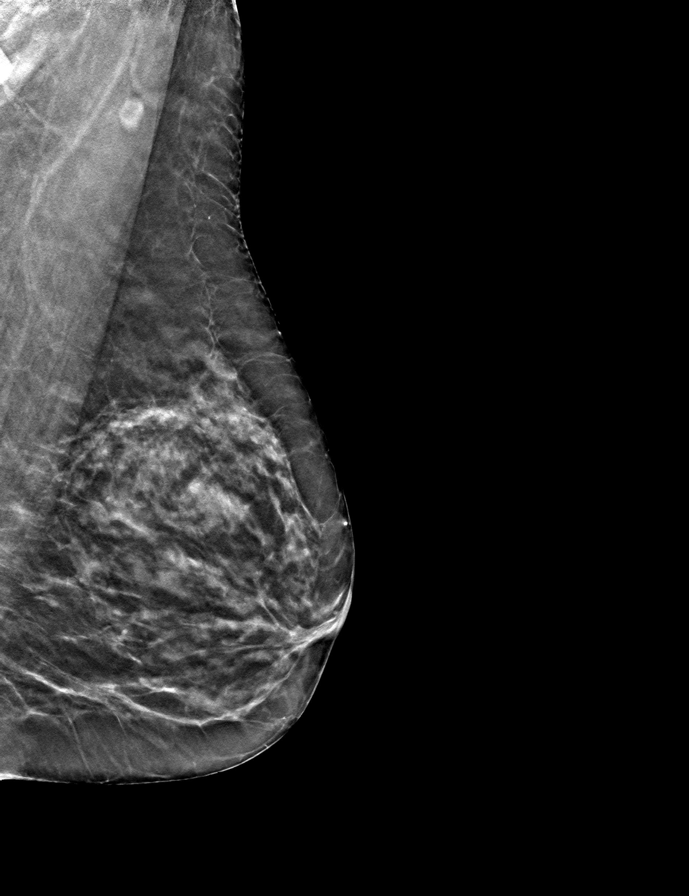

[9 of 24 positions shown; findings below may reference images not displayed]

ACR Breast Density Category c: The breast tissue is heterogeneously
dense, which may obscure small masses.
FINDINGS: There are no findings suspicious for malignancy. Images were
processed with CAD.
IMPRESSION: No mammographic evidence of malignancy. A result letter of this
screening mammogram will be mailed directly to the patient.

RECOMMENDATION:
Screening mammogram in one year. (Code:[id number])

BI-RADS CATEGORY  1: Negative.

## 2020-08-27 NOTE — Progress Notes (Signed)
Subjective:   Robin Stephens is a 70 y.o. female who presents for Medicare Annual (Subsequent) preventive examination.  Review of Systems    N/A  Cardiac Risk Factors include: advanced age (>23men, >58 women)     Objective:    Today's Vitals   08/31/20 0820  BP: (!) 142/70  Pulse: (!) 54  Temp: 97.8 F (36.6 C)  TempSrc: Oral  SpO2: 99%  Weight: 113 lb 12.8 oz (51.6 kg)  Height: 5\' 1"  (1.549 m)  PainSc: 0-No pain   Body mass index is 21.5 kg/m.  Advanced Directives 08/31/2020 08/20/2019 07/11/2018 07/11/2018  Does Patient Have a Medical Advance Directive? No No No Yes  Type of Advance Directive - - 09/09/2018;Living will  Does patient want to make changes to medical advance directive? - - Yes (MAU/Ambulatory/Procedural Areas - Information given) -  Copy of Healthcare Power of Attorney in Chart? - - - No - copy requested  Would patient like information on creating a medical advance directive? No - Patient declined No - Patient declined - -    Current Medications (verified) Outpatient Encounter Medications as of 08/31/2020  Medication Sig  . calcium carbonate (OS-CAL) 600 MG TABS tablet Take 1,200 mg by mouth daily with breakfast.   . Collagenase POWD by Does not apply route 2 (two) times daily. Unsure dose  . escitalopram (LEXAPRO) 10 MG tablet Take 1 tablet (10 mg total) by mouth daily.  . Ginger, Zingiber officinalis, 550 MG CAPS Take by mouth daily.   . Glucosamine-Chondroitin (GLUCOSAMINE CHONDR COMPLEX PO) Take by mouth daily. Unsure dose  . Lactobacillus (ACIDOPHILUS) 100 MG CAPS Take by mouth daily.   . MULTIPLE VITAMINS PO Take by mouth daily.   . Omega-3 Fatty Acids (FISH OIL) 1200 MG CAPS Take by mouth daily.   . TURMERIC PO Take by mouth daily. Unsure dose  . hydrOXYzine (ATARAX/VISTARIL) 10 MG tablet Take 1 tablet (10 mg total) by mouth 3 (three) times daily as needed. (Patient not taking: No sig reported)  . vitamin C (ASCORBIC ACID) 500 MG  tablet Take 500 mg by mouth daily.  (Patient not taking: Reported on 08/31/2020)   No facility-administered encounter medications on file as of 08/31/2020.    Allergies (verified) Sulfa antibiotics   History: History reviewed. No pertinent past medical history. Past Surgical History:  Procedure Laterality Date  . APPENDECTOMY  2006  . EYE SURGERY     PRK   Family History  Problem Relation Age of Onset  . Heart disease Father   . Obesity Sister   . Cancer Sister   . Breast cancer Sister 4       radiation; in remission  . Breast cancer Mother 32  . Healthy Sister    Social History   Socioeconomic History  . Marital status: Married    Spouse name: 54  . Number of children: 2  . Years of education: 66  . Highest education level: Some college, no degree  Occupational History  . Occupation: retired  Tobacco Use  . Smoking status: Never Smoker  . Smokeless tobacco: Never Used  Vaping Use  . Vaping Use: Never used  Substance and Sexual Activity  . Alcohol use: Yes    Alcohol/week: 0.0 - 3.0 standard drinks  . Drug use: No  . Sexual activity: Not on file  Other Topics Concern  . Not on file  Social History Narrative  . Not on file   Social Determinants of  Health   Financial Resource Strain: Low Risk   . Difficulty of Paying Living Expenses: Not hard at all  Food Insecurity: No Food Insecurity  . Worried About Programme researcher, broadcasting/film/video in the Last Year: Never true  . Ran Out of Food in the Last Year: Never true  Transportation Needs: No Transportation Needs  . Lack of Transportation (Medical): No  . Lack of Transportation (Non-Medical): No  Physical Activity: Sufficiently Active  . Days of Exercise per Week: 7 days  . Minutes of Exercise per Session: 30 min  Stress: No Stress Concern Present  . Feeling of Stress : Only a little  Social Connections: Moderately Isolated  . Frequency of Communication with Friends and Family: More than three times a week  . Frequency  of Social Gatherings with Friends and Family: Twice a week  . Attends Religious Services: Never  . Active Member of Clubs or Organizations: No  . Attends Banker Meetings: Never  . Marital Status: Married    Tobacco Counseling Counseling given: Not Answered   Clinical Intake:  Pre-visit preparation completed: Yes  Pain : No/denies pain Pain Score: 0-No pain     Nutritional Status: BMI of 19-24  Normal Nutritional Risks: None Diabetes: No  How often do you need to have someone help you when you read instructions, pamphlets, or other written materials from your doctor or pharmacy?: 1 - Never  Diabetic? No  Interpreter Needed?: No  Information entered by :: [facility], LPN   Activities of Daily Living In your present state of health, do you have any difficulty performing the following activities: 08/31/2020  Hearing? N  Vision? N  Difficulty concentrating or making decisions? N  Walking or climbing stairs? N  Dressing or bathing? N  Doing errands, shopping? N  Preparing Food and eating ? N  Using the Toilet? N  In the past six months, have you accidently leaked urine? N  Do you have problems with loss of bowel control? N  Managing your Medications? N  Managing your Finances? N  Housekeeping or managing your Housekeeping? N  Some recent data might be hidden    Patient Care Team: Erasmo Downer, MD as PCP - General (Family Medicine) Pa, [facility]  Indicate any recent Medical Services you may have received from other than [facility] providers in the past year (date may be approximate).     Assessment:   This is a routine wellness examination for Robin Stephens.  Hearing/Vision screen No exam data present  Dietary issues and exercise activities discussed: Current Exercise Habits: Home exercise routine, Type of exercise: Other - see comments;strength training/weights (uses an eliptical), Time (Minutes): 35, Frequency (Times/Week): 7, Weekly  Exercise (Minutes/Week): 245, Intensity: Mild, Exercise limited by: None identified  Goals    . DIET - INCREASE WATER INTAKE     Recommend to drink at least 6-8 8oz glasses of water per day.      Depression Screen PHQ 2/9 Scores 08/31/2020 08/20/2019 07/11/2018 07/10/2017 06/28/2016  PHQ - 2 Score 0 0 0 0 0    Fall Risk Fall Risk  08/31/2020 08/20/2019 07/11/2018 07/10/2017  Falls in the past year? 0 0 0 No  Number falls in past yr: 0 0 - -  Injury with Fall? 0 0 - -    FALL RISK PREVENTION PERTAINING TO THE HOME:  Any stairs in or around the home? Yes  If so, are there any without handrails? No  Home free of loose  throw rugs in walkways, pet beds, electrical cords, etc? Yes  Adequate lighting in your home to reduce risk of falls? Yes   ASSISTIVE DEVICES UTILIZED TO PREVENT FALLS:  Life alert? No  Use of a cane, walker or w/c? No  Grab bars in the bathroom? No  Shower chair or bench in shower? No  Elevated toilet seat or a handicapped toilet? No   TIMED UP AND GO:  Was the test performed? Yes .  Length of time to ambulate 10 feet: 8 sec.   Gait steady and fast without use of assistive device  Cognitive Function: Normal cognitive status assessed by direct observation by this Nurse Health Advisor. No abnormalities found.          Immunizations Immunization History  Administered Date(s) Administered  . Fluad Quad(high Dose 65+) 03/12/2019  . Influenza, High Dose Seasonal PF 03/06/2018  . Influenza,inj,Quad PF,6+ Mos 02/27/2017  . Moderna Sars-Covid-2 Vaccination 07/12/2019, 08/09/2019, 03/21/2020  . Pneumococcal Conjugate-13 07/10/2017  . Pneumococcal Polysaccharide-23 07/11/2018  . Td 10/17/2003  . Tdap 01/01/2013  . Zoster Recombinat (Shingrix) 12/05/2017, 02/06/2018    TDAP status: Up to date  Flu Vaccine status: Up to date  Pneumococcal vaccine status: Up to date  Covid-19 vaccine status: Completed vaccines  Qualifies for Shingles Vaccine? Yes   Zostavax  completed No   Shingrix Completed?: Yes  Screening Tests Health Maintenance  Topic Date Due  . INFLUENZA VACCINE  12/28/2020  . MAMMOGRAM  09/22/2021  . DEXA SCAN  08/09/2022  . TETANUS/TDAP  01/02/2023  . COLONOSCOPY (Pts 45-45yrs Insurance coverage will need to be confirmed)  09/24/2024  . COVID-19 Vaccine  Completed  . Hepatitis C Screening  Completed  . PNA vac Low Risk Adult  Completed  . HPV VACCINES  Aged Out    Health Maintenance  There are no preventive care reminders to display for this patient.  Colorectal cancer screening: Type of screening: Colonoscopy. Completed 09/25/14. Repeat every 10 years  Mammogram status: Completed 09/23/19. Repeat every year  Bone Density status: Completed 08/08/17. Results reflect: Bone density results: OSTEOPENIA. Repeat every 5 years.  Lung Cancer Screening: (Low Dose CT Chest recommended if Age 18-80 years, 30 pack-year currently smoking OR have quit w/in 15years.) does not qualify.   Additional Screening:  Hepatitis C Screening: Up to date  Vision Screening: Recommended annual ophthalmology exams for early detection of glaucoma and other disorders of the eye. Is the patient up to date with their annual eye exam?  Yes  Who is the provider or what is the name of the office in which the patient attends annual eye exams? [facility] If pt is not established with a provider, would they like to be referred to a provider to establish care? No .   Dental Screening: Recommended annual dental exams for proper oral hygiene  Community Resource Referral / Chronic Care Management: CRR required this visit?  No   CCM required this visit?  No      Plan:     I have personally reviewed and noted the following in the patient's chart:   . Medical and social history . Use of alcohol, tobacco or illicit drugs  . Current medications and supplements . Functional ability and status . Nutritional status . Physical activity . Advanced  directives . List of other physicians . Hospitalizations, surgeries, and ER visits in previous 12 months . Vitals . Screenings to include cognitive, depression, and falls . Referrals and appointments  In addition,  I have reviewed and discussed with patient certain preventive protocols, quality metrics, and best practice recommendations. A written personalized care plan for preventive services as well as general preventive health recommendations were provided to patient.     Adajah Cocking WickliffeMarkoski, [place]LPN   5/6/43324/08/2020   Nurse Notes: None.

## 2020-08-28 ENCOUNTER — Other Ambulatory Visit: Payer: Self-pay | Admitting: Family Medicine

## 2020-08-28 NOTE — Telephone Encounter (Signed)
[facility] faxed refill request for the following medications:  escitalopram (LEXAPRO) 10 MG tablet  Last Rx: 03/03/20 LOV: Please advise. Thanks TNP

## 2020-08-31 ENCOUNTER — Ambulatory Visit (INDEPENDENT_AMBULATORY_CARE_PROVIDER_SITE_OTHER): Payer: Medicare PPO | Admitting: Family Medicine

## 2020-08-31 ENCOUNTER — Encounter: Payer: Self-pay | Admitting: Family Medicine

## 2020-08-31 ENCOUNTER — Ambulatory Visit: Payer: Medicare PPO

## 2020-08-31 ENCOUNTER — Other Ambulatory Visit: Payer: Self-pay

## 2020-08-31 ENCOUNTER — Telehealth: Payer: Self-pay

## 2020-08-31 VITALS — BP 142/70 | HR 54 | Temp 97.8°F | Resp 16 | Ht 61.0 in | Wt 113.0 lb

## 2020-08-31 VITALS — BP 142/70 | HR 54 | Temp 97.8°F | Ht 61.0 in | Wt 113.8 lb

## 2020-08-31 DIAGNOSIS — F419 Anxiety disorder, unspecified: Secondary | ICD-10-CM

## 2020-08-31 DIAGNOSIS — E7800 Pure hypercholesterolemia, unspecified: Secondary | ICD-10-CM | POA: Diagnosis not present

## 2020-08-31 DIAGNOSIS — Z8249 Family history of ischemic heart disease and other diseases of the circulatory system: Secondary | ICD-10-CM | POA: Diagnosis not present

## 2020-08-31 DIAGNOSIS — Z1231 Encounter for screening mammogram for malignant neoplasm of breast: Secondary | ICD-10-CM | POA: Diagnosis not present

## 2020-08-31 DIAGNOSIS — Z0000 Encounter for general adult medical examination without abnormal findings: Secondary | ICD-10-CM

## 2020-08-31 MED ORDER — ESCITALOPRAM OXALATE 10 MG PO TABS: 10.0000 mg | ORAL_TABLET | Freq: Every day | ORAL | 1 refills | Status: DC

## 2020-08-31 NOTE — Assessment & Plan Note (Signed)
Stable and well controlled Continue lexapro

## 2020-08-31 NOTE — Telephone Encounter (Signed)
Copied from CRM [id number]. Topic: Appointment Scheduling - Scheduling Inquiry for Clinic >> Aug 31, 2020  2:00 PM Randol Kern wrote: Reason for CRM: Pt called requesting to speak to [facility] regarding a reschedule/questions. Please advise  Best contact: [phone]

## 2020-08-31 NOTE — Patient Instructions (Signed)
Robin Stephens , Thank you for taking time to come for your Medicare Wellness Visit. I appreciate your ongoing commitment to your health goals. Please review the following plan we discussed and let me know if I can assist you in the future.   Screening recommendations/referrals: Colonoscopy: Up to date, due 08/2024 Mammogram: Up to date, due 08/2020 Bone Density: Up to date, due 07/2022 Recommended yearly ophthalmology/optometry visit for glaucoma screening and checkup Recommended yearly dental visit for hygiene and checkup  Vaccinations: Influenza vaccine: Done 03/11/20 Pneumococcal vaccine: Completed series Tdap vaccine: Up to date, due 12/2022 Shingles vaccine: Completed series    Advanced directives: Advance directive discussed with you today. Even though you declined this today please call our office should you change your mind and we can give you the proper paperwork for you to fill out.  Conditions/risks identified: Recommend to drink at least 6-8 8oz glasses of water per day.  Next appointment: 9:00 AM today with Dr Alberteen Spindle    Preventive Care 14 Years and Older, Female Preventive care refers to lifestyle choices and visits with your health care provider that can promote health and wellness. What does preventive care include?  A yearly physical exam. This is also called an annual well check.  Dental exams once or twice a year.  Routine eye exams. Ask your health care provider how often you should have your eyes checked.  Personal lifestyle choices, including:  Daily care of your teeth and gums.  Regular physical activity.  Eating a healthy diet.  Avoiding tobacco and drug use.  Limiting alcohol use.  Practicing safe sex.  Taking low-dose aspirin every day.  Taking vitamin and mineral supplements as recommended by your health care provider. What happens during an annual well check? The services and screenings done by your health care provider during your annual well  check will depend on your age, overall health, lifestyle risk factors, and family history of disease. Counseling  Your health care provider may ask you questions about your:  Alcohol use.  Tobacco use.  Drug use.  Emotional well-being.  Home and relationship well-being.  Sexual activity.  Eating habits.  History of falls.  Memory and ability to understand (cognition).  Work and work Astronomer.  Reproductive health. Screening  You may have the following tests or measurements:  Height, weight, and BMI.  Blood pressure.  Lipid and cholesterol levels. These may be checked every 5 years, or more frequently if you are over 95 years old.  Skin check.  Lung cancer screening. You may have this screening every year starting at age 51 if you have a 30-pack-year history of smoking and currently smoke or have quit within the past 15 years.  Fecal occult blood test (FOBT) of the stool. You may have this test every year starting at age 64.  Flexible sigmoidoscopy or colonoscopy. You may have a sigmoidoscopy every 5 years or a colonoscopy every 10 years starting at age 27.  Hepatitis C blood test.  Hepatitis B blood test.  Sexually transmitted disease (STD) testing.  Diabetes screening. This is done by checking your blood sugar (glucose) after you have not eaten for a while (fasting). You may have this done every 1-3 years.  Bone density scan. This is done to screen for osteoporosis. You may have this done starting at age 65.  Mammogram. This may be done every 1-2 years. Talk to your health care provider about how often you should have regular mammograms. Talk with your health care provider  about your test results, treatment options, and if necessary, the need for more tests. Vaccines  Your health care provider may recommend certain vaccines, such as:  Influenza vaccine. This is recommended every year.  Tetanus, diphtheria, and acellular pertussis (Tdap, Td) vaccine. You  may need a Td booster every 10 years.  Zoster vaccine. You may need this after age 40.  Pneumococcal 13-valent conjugate (PCV13) vaccine. One dose is recommended after age 65.  Pneumococcal polysaccharide (PPSV23) vaccine. One dose is recommended after age 52. Talk to your health care provider about which screenings and vaccines you need and how often you need them. This information is not intended to replace advice given to you by your health care provider. Make sure you discuss any questions you have with your health care provider. Document Released: 06/12/2015 Document Revised: 02/03/2016 Document Reviewed: 03/17/2015 Elsevier Interactive Patient Education  2017 [place] Prevention in the Home Falls can cause injuries. They can happen to people of all ages. There are many things you can do to make your home safe and to help prevent falls. What can I do on the outside of my home?  Regularly fix the edges of walkways and driveways and fix any cracks.  Remove anything that might make you trip as you walk through a door, such as a raised step or threshold.  Trim any bushes or trees on the path to your home.  Use bright outdoor lighting.  Clear any walking paths of anything that might make someone trip, such as rocks or tools.  Regularly check to see if handrails are loose or broken. Make sure that both sides of any steps have handrails.  Any raised decks and porches should have guardrails on the edges.  Have any leaves, snow, or ice cleared regularly.  Use sand or salt on walking paths during winter.  Clean up any spills in your garage right away. This includes oil or grease spills. What can I do in the bathroom?  Use night lights.  Install grab bars by the toilet and in the tub and shower. Do not use towel bars as grab bars.  Use non-skid mats or decals in the tub or shower.  If you need to sit down in the shower, use a plastic, non-slip stool.  Keep the floor  dry. Clean up any water that spills on the floor as soon as it happens.  Remove soap buildup in the tub or shower regularly.  Attach bath mats securely with double-sided non-slip rug tape.  Do not have throw rugs and other things on the floor that can make you trip. What can I do in the bedroom?  Use night lights.  Make sure that you have a light by your bed that is easy to reach.  Do not use any sheets or blankets that are too big for your bed. They should not hang down onto the floor.  Have a firm chair that has side arms. You can use this for support while you get dressed.  Do not have throw rugs and other things on the floor that can make you trip. What can I do in the kitchen?  Clean up any spills right away.  Avoid walking on wet floors.  Keep items that you use a lot in easy-to-reach places.  If you need to reach something above you, use a strong step stool that has a grab bar.  Keep electrical cords out of the way.  Do not use floor polish or  wax that makes floors slippery. If you must use wax, use non-skid floor wax.  Do not have throw rugs and other things on the floor that can make you trip. What can I do with my stairs?  Do not leave any items on the stairs.  Make sure that there are handrails on both sides of the stairs and use them. Fix handrails that are broken or loose. Make sure that handrails are as long as the stairways.  Check any carpeting to make sure that it is firmly attached to the stairs. Fix any carpet that is loose or worn.  Avoid having throw rugs at the top or bottom of the stairs. If you do have throw rugs, attach them to the floor with carpet tape.  Make sure that you have a light switch at the top of the stairs and the bottom of the stairs. If you do not have them, ask someone to add them for you. What else can I do to help prevent falls?  Wear shoes that:  Do not have high heels.  Have rubber bottoms.  Are comfortable and fit you  well.  Are closed at the toe. Do not wear sandals.  If you use a stepladder:  Make sure that it is fully opened. Do not climb a closed stepladder.  Make sure that both sides of the stepladder are locked into place.  Ask someone to hold it for you, if possible.  Clearly mark and make sure that you can see:  Any grab bars or handrails.  First and last steps.  Where the edge of each step is.  Use tools that help you move around (mobility aids) if they are needed. These include:  Canes.  Walkers.  Scooters.  Crutches.  Turn on the lights when you go into a dark area. Replace any light bulbs as soon as they burn out.  Set up your furniture so you have a clear path. Avoid moving your furniture around.  If any of your floors are uneven, fix them.  If there are any pets around you, be aware of where they are.  Review your medicines with your doctor. Some medicines can make you feel dizzy. This can increase your chance of falling. Ask your doctor what other things that you can do to help prevent falls. This information is not intended to replace advice given to you by your health care provider. Make sure you discuss any questions you have with your health care provider. Document Released: 03/12/2009 Document Revised: 10/22/2015 Document Reviewed: 06/20/2014 Elsevier Interactive Patient Education  2017 [employer].

## 2020-08-31 NOTE — Progress Notes (Signed)
Annual Wellness Visit     Patient: Robin Stephens, Female    DOB: Dec 31, 1950, 70 y.o.   MRN: [id number] Visit Date: 08/31/2020  Today's Provider: Shirlee Latch, MD   Chief Complaint  Patient presents with  . Annual Wellness    Subjective    Robin Stephens is a 70 y.o. female who presents today for her Annual Wellness Visit. She reports consuming a general diet. Home exercise routine includes walking 1 hrs per day. She generally feels well. She reports sleeping well. She does not have additional problems to discuss today.   The 10-year ASCVD risk score Denman George DC Montez Hageman., et al., 2013) is: 9.3%   Social History   Tobacco Use  . Smoking status: Never Smoker  . Smokeless tobacco: Never Used  Vaping Use  . Vaping Use: Never used  Substance Use Topics  . Alcohol use: Yes    Alcohol/week: 0.0 - 3.0 standard drinks  . Drug use: No       Medications: Outpatient Medications Prior to Visit  Medication Sig  . calcium carbonate (OS-CAL) 600 MG TABS tablet Take 1,200 mg by mouth daily with breakfast.   . Collagenase POWD by Does not apply route 2 (two) times daily. Unsure dose  . escitalopram (LEXAPRO) 10 MG tablet Take 1 tablet (10 mg total) by mouth daily.  . Ginger, Zingiber officinalis, 550 MG CAPS Take by mouth daily.   . Glucosamine-Chondroitin (GLUCOSAMINE CHONDR COMPLEX PO) Take by mouth daily. Unsure dose  . Lactobacillus (ACIDOPHILUS) 100 MG CAPS Take by mouth daily.   . MULTIPLE VITAMINS PO Take by mouth daily.   . Omega-3 Fatty Acids (FISH OIL) 1200 MG CAPS Take by mouth daily.   . TURMERIC PO Take by mouth daily. Unsure dose  . [DISCONTINUED] hydrOXYzine (ATARAX/VISTARIL) 10 MG tablet Take 1 tablet (10 mg total) by mouth 3 (three) times daily as needed. (Patient not taking: No sig reported)  . [DISCONTINUED] vitamin C (ASCORBIC ACID) 500 MG tablet Take 500 mg by mouth daily.  (Patient not taking: Reported on 08/31/2020)   No facility-administered medications prior  to visit.    Allergies  Allergen Reactions  . Sulfa Antibiotics Rash    Patient Care Team: Erasmo Downer, MD as PCP - General (Family Medicine) [facility], [facility]  Review of Systems  All other systems reviewed and are negative.       Objective    Vitals: BP (!) 142/70   Pulse (!) 54   Temp 97.8 F (36.6 C)   Resp 16   Ht 5\' 1"  (1.549 m)   Wt 113 lb (51.3 kg)   BMI 21.35 kg/m    Physical Exam Vitals reviewed.  Constitutional:      General: She is not in acute distress.    Appearance: Normal appearance. She is well-developed. She is not diaphoretic.  HENT:     Head: Normocephalic and atraumatic.     Right Ear: Tympanic membrane, ear canal and external ear normal.     Left Ear: Tympanic membrane, ear canal and external ear normal.     Nose: Nose normal.     Mouth/Throat:     Mouth: Mucous membranes are moist.     Pharynx: Oropharynx is clear. No oropharyngeal exudate.  Eyes:     General: No scleral icterus.    Conjunctiva/sclera: Conjunctivae normal.     Pupils: Pupils are equal, round, and reactive to light.  Neck:     Thyroid:  No thyromegaly.  Cardiovascular:     Rate and Rhythm: Normal rate and regular rhythm.     Pulses: Normal pulses.     Heart sounds: Normal heart sounds. No murmur heard.   Pulmonary:     Effort: Pulmonary effort is normal. No respiratory distress.     Breath sounds: Normal breath sounds. No wheezing or rales.  Abdominal:     General: There is no distension.     Palpations: Abdomen is soft.     Tenderness: There is no abdominal tenderness.  Musculoskeletal:        General: No deformity.     Cervical back: Neck supple.     Right lower leg: No edema.     Left lower leg: No edema.  Lymphadenopathy:     Cervical: No cervical adenopathy.  Skin:    General: Skin is warm and dry.     Findings: No rash.  Neurological:     Mental Status: She is alert and oriented to person, place, and time. Mental status is at  baseline.     Sensory: No sensory deficit.     Motor: No weakness.     Gait: Gait normal.  Psychiatric:        Mood and Affect: Mood normal.        Behavior: Behavior normal.        Thought Content: Thought content normal.      Most recent functional status assessment: In your present state of health, do you have any difficulty performing the following activities: 08/31/2020  Hearing? N  Vision? N  Difficulty concentrating or making decisions? N  Walking or climbing stairs? N  Dressing or bathing? N  Doing errands, shopping? N  Preparing Food and eating ? N  Using the Toilet? N  In the past six months, have you accidently leaked urine? N  Do you have problems with loss of bowel control? N  Managing your Medications? N  Managing your Finances? N  Housekeeping or managing your Housekeeping? N  Some recent data might be hidden   Most recent fall risk assessment: Fall Risk  08/31/2020  Falls in the past year? 0  Number falls in past yr: 0  Injury with Fall? 0    Most recent depression screenings: PHQ 2/9 Scores 08/31/2020 08/20/2019  PHQ - 2 Score 0 0   Most recent cognitive screening: No flowsheet data found. Most recent Audit-C alcohol use screening Alcohol Use Disorder Test (AUDIT) 08/31/2020  1. How often do you have a drink containing alcohol? 2  2. How many drinks containing alcohol do you have on a typical day when you are drinking? 0  3. How often do you have six or more drinks on one occasion? 0  AUDIT-C Score 2  Alcohol Brief Interventions/Follow-up -   A score of 3 or more in women, and 4 or more in men indicates increased risk for alcohol abuse, EXCEPT if all of the points are from question 1   No results found for any visits on 08/31/20.  Assessment & Plan     Annual wellness visit done today including the all of the following: Reviewed patient's Family Medical History Reviewed and updated list of patient's medical providers Assessment of cognitive impairment  was done Assessed patient's functional ability Established a written schedule for health screening services Health Risk Assessent Completed and Reviewed  Exercise Activities and Dietary recommendations Goals    . DIET - INCREASE WATER INTAKE     Recommend  to drink at least 6-8 8oz glasses of water per day.       Immunization History  Administered Date(s) Administered  . Fluad Quad(high Dose 65+) 03/12/2019  . Influenza, High Dose Seasonal PF 03/06/2018  . Influenza,inj,Quad PF,6+ Mos 02/27/2017  . Moderna Sars-Covid-2 Vaccination 07/12/2019, 08/09/2019, 03/21/2020  . Pneumococcal Conjugate-13 07/10/2017  . Pneumococcal Polysaccharide-23 07/11/2018  . Td 10/17/2003  . Tdap 01/01/2013  . Zoster Recombinat (Shingrix) 12/05/2017, 02/06/2018    Health Maintenance  Topic Date Due  . INFLUENZA VACCINE  12/28/2020  . MAMMOGRAM  09/22/2021  . DEXA SCAN  08/09/2022  . TETANUS/TDAP  01/02/2023  . COLONOSCOPY (Pts 45-49yrs Insurance coverage will need to be confirmed)  09/24/2024  . COVID-19 Vaccine  Completed  . Hepatitis C Screening  Completed  . PNA vac Low Risk Adult  Completed  . HPV VACCINES  Aged Out     Discussed health benefits of physical activity, and encouraged her to engage in regular exercise appropriate for her age and condition.    Problem List Items Addressed This Visit      Other   Anxiety    Stable and well controlled Continue lexapro      Hypercholesteremia    Reviewed last lipid panel Not currently on a statin Recheck FLP and CMP Discussed diet and exercise       Relevant Orders   Lipid panel   Comprehensive metabolic panel   Family history of heart disease    Discussed risk stratification with CT cardiac as her son recently had high coronary calcium score and her father had MI in his 47s and died of cardiac cause.       Other Visit Diagnoses    Encounter for annual physical exam    -  Primary   Relevant Orders   Lipid panel    Comprehensive metabolic panel   Encounter for screening mammogram for malignant neoplasm of breast       Relevant Orders   MM 3D SCREEN BREAST BILATERAL       Return in about 1 year (around 08/31/2021) for CPE, AWV.     I, Shirlee Latch, MD, have reviewed all documentation for this visit. The documentation on 08/31/20 for the exam, diagnosis, procedures, and orders are all accurate and complete.   Lakendrick Paradis, Marzella Schlein, MD, MPH [facility]

## 2020-08-31 NOTE — Patient Instructions (Signed)
Preventive Care 68 Years and Older, Female Preventive care refers to lifestyle choices and visits with your health care provider that can promote health and wellness. This includes:  A yearly physical exam. This is also called an annual wellness visit.  Regular dental and eye exams.  Immunizations.  Screening for certain conditions.  Healthy lifestyle choices, such as: ? Eating a healthy diet. ? Getting regular exercise. ? Not using drugs or products that contain nicotine and tobacco. ? Limiting alcohol use. What can I expect for my preventive care visit? Physical exam Your health care provider will check your:  Height and weight. These may be used to calculate your BMI (body mass index). BMI is a measurement that tells if you are at a healthy weight.  Heart rate and blood pressure.  Body temperature.  Skin for abnormal spots. Counseling Your health care provider may ask you questions about your:  Past medical problems.  Family's medical history.  Alcohol, tobacco, and drug use.  Emotional well-being.  Home life and relationship well-being.  Sexual activity.  Diet, exercise, and sleep habits.  History of falls.  Memory and ability to understand (cognition).  Work and work Statistician.  Pregnancy and menstrual history.  Access to firearms. What immunizations do I need? Vaccines are usually given at various ages, according to a schedule. Your health care provider will recommend vaccines for you based on your age, medical history, and lifestyle or other factors, such as travel or where you work.   What tests do I need? Blood tests  Lipid and cholesterol levels. These may be checked every 5 years, or more often depending on your overall health.  Hepatitis C test.  Hepatitis B test. Screening  Lung cancer screening. You may have this screening every year starting at age 68 if you have a 30-pack-year history of smoking and currently smoke or have quit within  the past 15 years.  Colorectal cancer screening. ? All adults should have this screening starting at age 68 and continuing until age 68. ? Your health care provider may recommend screening at age 68 if you are at increased risk. ? You will have tests every 1-10 years, depending on your results and the type of screening test.  Diabetes screening. ? This is done by checking your blood sugar (glucose) after you have not eaten for a while (fasting). ? You may have this done every 1-3 years.  Mammogram. ? This may be done every 1-2 years. ? Talk with your health care provider about how often you should have regular mammograms.  Abdominal aortic aneurysm (AAA) screening. You may need this if you are a current or former smoker.  BRCA-related cancer screening. This may be done if you have a family history of breast, ovarian, tubal, or peritoneal cancers. Other tests  STD (sexually transmitted disease) testing, if you are at risk.  Bone density scan. This is done to screen for osteoporosis. You may have this done starting at age 68. Talk with your health care provider about your test results, treatment options, and if necessary, the need for more tests. Follow these instructions at home: Eating and drinking  Eat a diet that includes fresh fruits and vegetables, whole grains, lean protein, and low-fat dairy products. Limit your intake of foods with high amounts of sugar, saturated fats, and salt.  Take vitamin and mineral supplements as recommended by your health care provider.  Do not drink alcohol if your health care provider tells you not to drink.  If you drink alcohol: ? Limit how much you have to 0-1 drink a day. ? Be aware of how much alcohol is in your drink. In the [place] one drink equals one 12 oz bottle of beer (355 mL), one 5 oz glass of wine (148 mL), or one 1 oz glass of hard liquor (44 mL).   Lifestyle  Take daily care of your teeth and gums. Brush your teeth every morning  and night with fluoride toothpaste. Floss one time each day.  Stay active. Exercise for at least 30 minutes 5 or more days each week.  Do not use any products that contain nicotine or tobacco, such as cigarettes, e-cigarettes, and chewing tobacco. If you need help quitting, ask your health care provider.  Do not use drugs.  If you are sexually active, practice safe sex. Use a condom or other form of protection in order to prevent STIs (sexually transmitted infections).  Talk with your health care provider about taking a low-dose aspirin or statin.  Find healthy ways to cope with stress, such as: ? Meditation, yoga, or listening to music. ? Journaling. ? Talking to a trusted person. ? Spending time with friends and family. Safety  Always wear your seat belt while driving or riding in a vehicle.  Do not drive: ? If you have been drinking alcohol. Do not ride with someone who has been drinking. ? When you are tired or distracted. ? While texting.  Wear a helmet and other protective equipment during sports activities.  If you have firearms in your house, make sure you follow all gun safety procedures. What's next?  Visit your health care provider once a year for an annual wellness visit.  Ask your health care provider how often you should have your eyes and teeth checked.  Stay up to date on all vaccines. This information is not intended to replace advice given to you by your health care provider. Make sure you discuss any questions you have with your health care provider. Document Revised: 05/06/2020 Document Reviewed: 05/10/2018 Elsevier Patient Education  2021 [employer]  

## 2020-08-31 NOTE — Telephone Encounter (Signed)
Spoke with pt who wanted to r/s her AWV and CPE next year. Apts were scheduled for 09/06/21 however pts family comes into town that week so AWV and CPE were rescheduled for 09/13/20.

## 2020-08-31 NOTE — Assessment & Plan Note (Signed)
Reviewed last lipid panel Not currently on a statin Recheck FLP and CMP Discussed diet and exercise  

## 2020-08-31 NOTE — Assessment & Plan Note (Signed)
Discussed risk stratification with CT cardiac as her son recently had high coronary calcium score and her father had MI in his 63s and died of cardiac cause.

## 2020-09-01 LAB — COMPREHENSIVE METABOLIC PANEL
ALT: 18 IU/L (ref 0–32)
AST: 18 IU/L (ref 0–40)
Albumin/Globulin Ratio: 1.8 (ref 1.2–2.2)
Albumin: 4.3 g/dL (ref 3.8–4.8)
Alkaline Phosphatase: 58 IU/L (ref 44–121)
BUN/Creatinine Ratio: 33 — ABNORMAL HIGH (ref 12–28)
BUN: 22 mg/dL (ref 8–27)
Bilirubin Total: 0.4 mg/dL (ref 0.0–1.2)
CO2: 23 mmol/L (ref 20–29)
Calcium: 9 mg/dL (ref 8.7–10.3)
Chloride: 104 mmol/L (ref 96–106)
Creatinine, Ser: 0.67 mg/dL (ref 0.57–1.00)
Globulin, Total: 2.4 g/dL (ref 1.5–4.5)
Glucose: 100 mg/dL — ABNORMAL HIGH (ref 65–99)
Potassium: 4.4 mmol/L (ref 3.5–5.2)
Sodium: 143 mmol/L (ref 134–144)
Total Protein: 6.7 g/dL (ref 6.0–8.5)
eGFR: 95 mL/min/{1.73_m2} (ref >59)

## 2020-09-01 LAB — LIPID PANEL
Chol/HDL Ratio: 3.6 ratio (ref 0.0–4.4)
Cholesterol, Total: 286 mg/dL — ABNORMAL HIGH (ref 100–199)
HDL: 80 mg/dL (ref >39)
LDL Chol Calc (NIH): 185 mg/dL — ABNORMAL HIGH (ref 0–99)
Triglycerides: 121 mg/dL (ref 0–149)
VLDL Cholesterol Cal: 21 mg/dL (ref 5–40)

## 2020-09-16 ENCOUNTER — Other Ambulatory Visit: Payer: Self-pay

## 2020-09-16 ENCOUNTER — Ambulatory Visit
Admission: RE | Admit: 2020-09-16 | Discharge: 2020-09-16 | Disposition: A | Discharge status: Home / Self Care | Payer: Medicare PPO | Source: Ambulatory Visit | Attending: Family Medicine | Admitting: Family Medicine

## 2020-09-16 DIAGNOSIS — I700 Atherosclerosis of aorta: Secondary | ICD-10-CM | POA: Insufficient documentation

## 2020-09-18 ENCOUNTER — Encounter: Payer: Self-pay | Admitting: Family Medicine

## 2020-09-21 ENCOUNTER — Telehealth: Payer: Self-pay | Admitting: Family Medicine

## 2020-09-21 ENCOUNTER — Other Ambulatory Visit: Payer: Self-pay | Admitting: *Deleted

## 2020-09-21 DIAGNOSIS — E7800 Pure hypercholesterolemia, unspecified: Secondary | ICD-10-CM

## 2020-09-21 MED ORDER — ROSUVASTATIN CALCIUM 10 MG PO TABS: 10.0000 mg | ORAL_TABLET | Freq: Every day | ORAL | 5 refills | Status: DC

## 2020-09-21 NOTE — Telephone Encounter (Addendum)
Patient checking on the status of  rosuvastatin 10 mg medication and would like it sent to [facility], patient would like a follow up call when sent. (please reference 09/18/2020 My Chart message)  [id number] - [place], [place] - [address] AT [facility] [address] Phone:  [phone]  Fax:  [phone]

## 2020-09-21 NOTE — Telephone Encounter (Signed)
Rx was sent to pharmacy. 

## 2020-09-23 ENCOUNTER — Ambulatory Visit
Admission: RE | Admit: 2020-09-23 | Discharge: 2020-09-23 | Disposition: A | Discharge status: Home / Self Care | Payer: Medicare PPO | Source: Ambulatory Visit | Attending: Family Medicine | Admitting: Family Medicine

## 2020-09-23 ENCOUNTER — Other Ambulatory Visit: Payer: Self-pay

## 2020-09-23 DIAGNOSIS — Z1231 Encounter for screening mammogram for malignant neoplasm of breast: Secondary | ICD-10-CM | POA: Diagnosis not present

## 2020-10-08 ENCOUNTER — Encounter: Payer: Self-pay | Admitting: Family Medicine

## 2021-01-04 ENCOUNTER — Other Ambulatory Visit: Payer: Self-pay | Admitting: Family Medicine

## 2021-01-04 DIAGNOSIS — E7800 Pure hypercholesterolemia, unspecified: Secondary | ICD-10-CM

## 2021-02-22 ENCOUNTER — Telehealth: Payer: Self-pay | Admitting: Family Medicine

## 2021-02-23 NOTE — Telephone Encounter (Signed)
Pt calling to check status. Please advise  °

## 2021-03-16 ENCOUNTER — Other Ambulatory Visit: Payer: Self-pay | Admitting: Family Medicine

## 2021-03-16 DIAGNOSIS — E7800 Pure hypercholesterolemia, unspecified: Secondary | ICD-10-CM

## 2021-03-16 NOTE — Telephone Encounter (Signed)
Requested Prescriptions  Pending Prescriptions Disp Refills  . rosuvastatin (CRESTOR) 10 MG tablet [Pharmacy Med Name: ROSUVASTATIN 10MG  TABLETS] 30 tablet 5    Sig: TAKE 1 TABLET(10 MG) BY MOUTH DAILY     Cardiovascular:  Antilipid - Statins Failed - 03/16/2021  1:40 PM      Failed - Total Cholesterol in normal range and within 360 days    Cholesterol, Total  Date Value Ref Range Status  08/31/2020 286 (H) 100 - 199 mg/dL Final         Failed - LDL in normal range and within 360 days    LDL Chol Calc (NIH)  Date Value Ref Range Status  08/31/2020 185 (H) 0 - 99 mg/dL Final         Passed - HDL in normal range and within 360 days    HDL  Date Value Ref Range Status  08/31/2020 80 >39 mg/dL Final         Passed - Triglycerides in normal range and within 360 days    Triglycerides  Date Value Ref Range Status  08/31/2020 121 0 - 149 mg/dL Final         Passed - Patient is not pregnant      Passed - Valid encounter within last 12 months    Recent Outpatient Visits          6 months ago Encounter for annual physical exam   10/31/2020, [facility], MD   1 year ago Encounter for annual physical exam   [facility] [place], [place], [place]   2 years ago Encounter for annual physical exam   [facility] [place], [place], MD   3 years ago Rash and nonspecific skin eruption   [facility] [place], [place], MD   3 years ago Contact dermatitis, unspecified contact dermatitis type, unspecified trigger   [facility] Bacigalupo, [facility], MD      Future Appointments            In 6 months Bacigalupo, Marzella Schlein, MD [facility]

## 2021-05-22 ENCOUNTER — Other Ambulatory Visit: Payer: Self-pay | Admitting: Family Medicine

## 2021-05-22 NOTE — Telephone Encounter (Signed)
Requested Prescriptions  Pending Prescriptions Disp Refills   escitalopram (LEXAPRO) 10 MG tablet [Pharmacy Med Name: ESCITALOPRAM 10MG  TABLETS] 90 tablet 0    Sig: TAKE 1 TABLET(10 MG) BY MOUTH DAILY     Psychiatry:  Antidepressants - SSRI Failed - 05/22/2021  3:29 AM      Failed - Valid encounter within last 6 months    Recent Outpatient Visits          8 months ago Encounter for annual physical exam   [facility] [place], [place], MD   1 year ago Encounter for annual physical exam   [facility] [place], [place], MD   2 years ago Encounter for annual physical exam   [facility] [place], [place], MD   3 years ago Rash and nonspecific skin eruption   [facility] [place], [place], MD   3 years ago Contact dermatitis, unspecified contact dermatitis type, unspecified trigger   [facility] Bacigalupo, [facility], MD      Future Appointments            In 3 months Bacigalupo, Marzella Schlein, MD [facility]

## 2021-06-01 ENCOUNTER — Encounter: Payer: Self-pay | Admitting: Family Medicine

## 2021-06-01 MED ORDER — MOLNUPIRAVIR EUA 200MG CAPSULE: 4.0000 | ORAL_CAPSULE | Freq: Two times a day (BID) | ORAL | 0 refills | Status: AC

## 2021-08-21 ENCOUNTER — Other Ambulatory Visit: Payer: Self-pay | Admitting: Family Medicine

## 2021-09-06 ENCOUNTER — Encounter: Payer: Medicare PPO | Admitting: Family Medicine

## 2021-09-06 ENCOUNTER — Other Ambulatory Visit: Payer: Self-pay

## 2021-09-06 ENCOUNTER — Telehealth: Payer: Self-pay | Admitting: Family Medicine

## 2021-09-06 DIAGNOSIS — E7800 Pure hypercholesterolemia, unspecified: Secondary | ICD-10-CM

## 2021-09-06 MED ORDER — ROSUVASTATIN CALCIUM 10 MG PO TABS: ORAL_TABLET | ORAL | 0 refills | Status: DC

## 2021-09-06 NOTE — Telephone Encounter (Signed)
[facility] pharmacy faxed refill request for the following medications:   rosuvastatin (CRESTOR) 10 MG tablet   Please advise  

## 2021-09-10 NOTE — Progress Notes (Signed)
? ? ?I,Sulibeya S Dimas,acting as a scribe for Lavon Paganini, MD.,have documented all relevant documentation on the behalf of Lavon Paganini, MD,as directed by  Lavon Paganini, MD while in the presence of Lavon Paganini, MD. ? ? ?Complete physical exam ? ? ?Patient: Robin Stephens   DOB: 1950/06/21   71 y.o. Female  MRN: [id number] ?Visit Date: 09/13/2021 ? ?Today's healthcare provider: Lavon Paganini, MD  ? ?Chief Complaint  ?Patient presents with  ? Annual Exam  ? ?Subjective  ?  ?Robin Stephens is a 71 y.o. female who presents today for a complete physical exam.  ?She reports consuming a general diet. Gym/ health club routine includes low impact aerobics. She generally feels well. She reports sleeping well. She does not have additional problems to discuss today.  ?HPI  ? ?History reviewed. No pertinent past medical history. ?Past Surgical History:  ?Procedure Laterality Date  ? APPENDECTOMY  2006  ? EYE SURGERY    ? [place]  ? ?Social History  ? ?Socioeconomic History  ? Marital status: Married  ?  Spouse name: Audry Pili  ? Number of children: 2  ? Years of education: 80  ? Highest education level: Some college, no degree  ?Occupational History  ? Occupation: retired  ?Tobacco Use  ? Smoking status: Never  ? Smokeless tobacco: Never  ?Vaping Use  ? Vaping Use: Never used  ?Substance and Sexual Activity  ? Alcohol use: Yes  ?  Alcohol/week: 0.0 - 3.0 standard drinks  ? Drug use: No  ? Sexual activity: Not on file  ?Other Topics Concern  ? Not on file  ?Social History Narrative  ? Not on file  ? ?Social Determinants of Health  ? ?Financial Resource Strain: Not on file  ?Food Insecurity: Not on file  ?Transportation Needs: Not on file  ?Physical Activity: Not on file  ?Stress: Not on file  ?Social Connections: Not on file  ?Intimate Partner Violence: Not on file  ? ?Family Status  ?Relation Name Status  ? Father  Deceased  ? Sister  Alive  ? Mother  Deceased  ? Sister  Alive  ? Sister  Deceased  ?     MVA   ? ?Family History  ?Problem Relation Age of Onset  ? Heart disease Father   ? Obesity Sister   ? Cancer Sister   ? Breast cancer Sister 83  ?     radiation; in remission  ? Breast cancer Mother 73  ? Healthy Sister   ? ?Allergies  ?Allergen Reactions  ? Sulfa Antibiotics Rash  ?  ?Patient Care Team: ?Virginia Crews, MD as PCP - General (Family Medicine) ?Pa, [place]  ? ?Medications: ?Outpatient Medications Prior to Visit  ?Medication Sig  ? calcium carbonate (OS-CAL) 600 MG TABS tablet Take 1,200 mg by mouth daily with breakfast.   ? Collagenase POWD by Does not apply route 2 (two) times daily. Unsure dose  ? escitalopram (LEXAPRO) 10 MG tablet TAKE 1 TABLET(10 MG) BY MOUTH DAILY  ? Ginger, Zingiber officinalis, 550 MG CAPS Take by mouth daily.   ? Glucosamine-Chondroitin (GLUCOSAMINE CHONDR COMPLEX PO) Take by mouth daily. Unsure dose  ? Lactobacillus (ACIDOPHILUS) 100 MG CAPS Take by mouth daily.   ? MULTIPLE VITAMINS PO Take by mouth daily.   ? Omega-3 Fatty Acids (FISH OIL) 1200 MG CAPS Take by mouth daily.   ? rosuvastatin (CRESTOR) 10 MG tablet TAKE 1 TABLET(10 MG) BY MOUTH DAILY  ? TURMERIC PO  Take by mouth daily. Unsure dose  ? ?No facility-administered medications prior to visit.  ? ? ?Review of Systems  ?Constitutional: Negative.   ?HENT: Negative.    ?Eyes: Negative.   ?Respiratory: Negative.    ?Gastrointestinal: Negative.   ?Endocrine: Negative.   ?Genitourinary: Negative.   ?Musculoskeletal: Negative.   ?Skin: Negative.   ?Allergic/Immunologic: Negative.   ?Neurological: Negative.   ?Hematological: Negative.   ?Psychiatric/Behavioral: Negative.    ? ?Last CBC ?Lab Results  ?Component Value Date  ? WBC 6.3 08/20/2019  ? HGB 13.5 08/20/2019  ? HCT 41.8 08/20/2019  ? MCV 96 08/20/2019  ? MCH 30.9 08/20/2019  ? RDW 11.9 08/20/2019  ? PLT 215 08/20/2019  ? ?Last metabolic panel ?Lab Results  ?Component Value Date  ? GLUCOSE 100 (H) 08/31/2020  ? NA 143 08/31/2020  ? K 4.4 08/31/2020  ?  CL 104 08/31/2020  ? CO2 23 08/31/2020  ? BUN 22 08/31/2020  ? CREATININE 0.67 08/31/2020  ? EGFR 95 08/31/2020  ? CALCIUM 9.0 08/31/2020  ? PROT 6.7 08/31/2020  ? ALBUMIN 4.3 08/31/2020  ? LABGLOB 2.4 08/31/2020  ? AGRATIO 1.8 08/31/2020  ? BILITOT 0.4 08/31/2020  ? ALKPHOS 58 08/31/2020  ? AST 18 08/31/2020  ? ALT 18 08/31/2020  ? ?Last lipids ?Lab Results  ?Component Value Date  ? CHOL 286 (H) 08/31/2020  ? HDL 80 08/31/2020  ? LDLCALC 185 (H) 08/31/2020  ? TRIG 121 08/31/2020  ? CHOLHDL 3.6 08/31/2020  ? ?Last hemoglobin A1c ?Lab Results  ?Component Value Date  ? HGBA1C 5.2 07/21/2017  ? ?Last thyroid functions ?Lab Results  ?Component Value Date  ? TSH 1.670 06/29/2016  ? ?  ? Objective  ?  ?BP 122/70 (BP Location: Left Arm, Patient Position: Sitting, Cuff Size: Normal)   Pulse (!) 50   Temp 98.6 ?F (37 ?C) (Oral)   Resp 16   Ht 5' 1"  (1.549 m)   Wt 113 lb (51.3 kg)   SpO2 99%   BMI 21.35 kg/m?  ?BP Readings from Last 3 Encounters:  ?09/13/21 122/70  ?08/31/20 (!) 142/70  ?08/31/20 (!) 142/70  ? ?Wt Readings from Last 3 Encounters:  ?09/13/21 113 lb (51.3 kg)  ?08/31/20 113 lb (51.3 kg)  ?08/31/20 113 lb 12.8 oz (51.6 kg)  ? ?  ? ? ?Physical Exam ?Vitals reviewed.  ?Constitutional:   ?   General: She is not in acute distress. ?   Appearance: Normal appearance. She is well-developed. She is not diaphoretic.  ?HENT:  ?   Head: Normocephalic and atraumatic.  ?   Right Ear: Tympanic membrane, ear canal and external ear normal.  ?   Left Ear: Tympanic membrane, ear canal and external ear normal.  ?   Nose: Nose normal.  ?   Mouth/Throat:  ?   Mouth: Mucous membranes are moist.  ?   Pharynx: Oropharynx is clear. No oropharyngeal exudate.  ?Eyes:  ?   General: No scleral icterus. ?   Conjunctiva/sclera: Conjunctivae normal.  ?   Pupils: Pupils are equal, round, and reactive to light.  ?Neck:  ?   Thyroid: No thyromegaly.  ?Cardiovascular:  ?   Rate and Rhythm: Normal rate and regular rhythm.  ?   Pulses: Normal  pulses.  ?   Heart sounds: Normal heart sounds. No murmur heard. ?Pulmonary:  ?   Effort: Pulmonary effort is normal. No respiratory distress.  ?   Breath sounds: Normal breath sounds. No wheezing or rales.  ?  Abdominal:  ?   General: There is no distension.  ?   Palpations: Abdomen is soft.  ?   Tenderness: There is no abdominal tenderness.  ?Musculoskeletal:     ?   General: No deformity.  ?   Cervical back: Neck supple.  ?   Right lower leg: No edema.  ?   Left lower leg: No edema.  ?Lymphadenopathy:  ?   Cervical: No cervical adenopathy.  ?Skin: ?   General: Skin is warm and dry.  ?   Findings: No rash.  ?Neurological:  ?   Mental Status: She is alert and oriented to person, place, and time. Mental status is at baseline.  ?   Sensory: No sensory deficit.  ?   Motor: No weakness.  ?   Gait: Gait normal.  ?Psychiatric:     ?   Mood and Affect: Mood normal.     ?   Behavior: Behavior normal.     ?   Thought Content: Thought content normal.  ?  ? ?Last depression screening scores ? ?  09/13/2021  ?  8:46 AM 08/31/2020  ?  8:29 AM 08/20/2019  ? 10:05 AM  ?PHQ 2/9 Scores  ?PHQ - 2 Score 0 0 0  ?PHQ- 9 Score 0    ? ?Last fall risk screening ? ?  09/13/2021  ?  8:46 AM  ?Fall Risk   ?Falls in the past year? 0  ?Number falls in past yr: 0  ?Injury with Fall? 0  ?Risk for fall due to : No Fall Risks  ?Follow up Falls evaluation completed  ? ?Last Audit-C alcohol use screening ? ?  09/13/2021  ?  8:45 AM  ?Alcohol Use Disorder Test (AUDIT)  ?1. How often do you have a drink containing alcohol? 2  ?2. How many drinks containing alcohol do you have on a typical day when you are drinking? 0  ?3. How often do you have six or more drinks on one occasion? 0  ?AUDIT-C Score 2  ? ?A score of 3 or more in women, and 4 or more in men indicates increased risk for alcohol abuse, EXCEPT if all of the points are from question 1  ? ?No results found for any visits on 09/13/21. ? Assessment & Plan  ?  ?Routine Health Maintenance and Physical  Exam ? ?Exercise Activities and Dietary recommendations ? Goals   ? ?  DIET - INCREASE WATER INTAKE   ?  Recommend to drink at least 6-8 8oz glasses of water per day. ? ?  ? ?  ? ? ?Immunization History  ?Admini

## 2021-09-13 ENCOUNTER — Encounter: Payer: Self-pay | Admitting: Family Medicine

## 2021-09-13 ENCOUNTER — Ambulatory Visit (INDEPENDENT_AMBULATORY_CARE_PROVIDER_SITE_OTHER): Payer: Medicare PPO | Admitting: Family Medicine

## 2021-09-13 VITALS — BP 122/70 | HR 50 | Temp 98.6°F | Resp 16 | Ht 61.0 in | Wt 113.0 lb

## 2021-09-13 DIAGNOSIS — F419 Anxiety disorder, unspecified: Secondary | ICD-10-CM | POA: Diagnosis not present

## 2021-09-13 DIAGNOSIS — F5104 Psychophysiologic insomnia: Secondary | ICD-10-CM | POA: Diagnosis not present

## 2021-09-13 DIAGNOSIS — Z1231 Encounter for screening mammogram for malignant neoplasm of breast: Secondary | ICD-10-CM

## 2021-09-13 DIAGNOSIS — E7800 Pure hypercholesterolemia, unspecified: Secondary | ICD-10-CM | POA: Diagnosis not present

## 2021-09-13 DIAGNOSIS — Z0000 Encounter for general adult medical examination without abnormal findings: Secondary | ICD-10-CM

## 2021-09-13 NOTE — Assessment & Plan Note (Signed)
Stable and well controlled Continue lexapro 

## 2021-09-13 NOTE — Assessment & Plan Note (Signed)
Well controlled ?2/2 anxiety ?Continue lexapro ?

## 2021-09-13 NOTE — Assessment & Plan Note (Signed)
Previously slightly elevated ?Started statin after CT cardiac Ca score high ?Continue statin ?Repeat FLP and CMP ?

## 2021-09-14 LAB — LIPID PANEL WITH LDL/HDL RATIO
Cholesterol, Total: 159 mg/dL (ref 100–199)
HDL: 75 mg/dL (ref >39)
LDL Chol Calc (NIH): 70 mg/dL (ref 0–99)
LDL/HDL Ratio: 0.9 ratio (ref 0.0–3.2)
Triglycerides: 73 mg/dL (ref 0–149)
VLDL Cholesterol Cal: 14 mg/dL (ref 5–40)

## 2021-09-14 LAB — COMPREHENSIVE METABOLIC PANEL
ALT: 21 IU/L (ref 0–32)
AST: 25 IU/L (ref 0–40)
Albumin/Globulin Ratio: 1.8 (ref 1.2–2.2)
Albumin: 4.2 g/dL (ref 3.8–4.8)
Alkaline Phosphatase: 48 IU/L (ref 44–121)
BUN/Creatinine Ratio: 36 — ABNORMAL HIGH (ref 12–28)
BUN: 25 mg/dL (ref 8–27)
Bilirubin Total: 0.4 mg/dL (ref 0.0–1.2)
CO2: 24 mmol/L (ref 20–29)
Calcium: 9.1 mg/dL (ref 8.7–10.3)
Chloride: 105 mmol/L (ref 96–106)
Creatinine, Ser: 0.7 mg/dL (ref 0.57–1.00)
Globulin, Total: 2.4 g/dL (ref 1.5–4.5)
Glucose: 96 mg/dL (ref 70–99)
Potassium: 4.3 mmol/L (ref 3.5–5.2)
Sodium: 142 mmol/L (ref 134–144)
Total Protein: 6.6 g/dL (ref 6.0–8.5)
eGFR: 94 mL/min/{1.73_m2} (ref >59)

## 2021-09-17 ENCOUNTER — Other Ambulatory Visit: Payer: Self-pay | Admitting: Family Medicine

## 2021-10-01 ENCOUNTER — Ambulatory Visit
Admission: RE | Admit: 2021-10-01 | Discharge: 2021-10-01 | Disposition: A | Discharge status: Home / Self Care | Payer: Medicare PPO | Source: Ambulatory Visit | Attending: Family Medicine | Admitting: Family Medicine

## 2021-10-01 DIAGNOSIS — Z1231 Encounter for screening mammogram for malignant neoplasm of breast: Secondary | ICD-10-CM | POA: Insufficient documentation

## 2021-10-01 DIAGNOSIS — Z0000 Encounter for general adult medical examination without abnormal findings: Secondary | ICD-10-CM | POA: Diagnosis not present

## 2021-10-04 ENCOUNTER — Other Ambulatory Visit: Payer: Self-pay | Admitting: Family Medicine

## 2021-10-04 DIAGNOSIS — E7800 Pure hypercholesterolemia, unspecified: Secondary | ICD-10-CM

## 2021-10-05 NOTE — Telephone Encounter (Signed)
Requested Prescriptions  ?Pending Prescriptions Disp Refills  ?? rosuvastatin (CRESTOR) 10 MG tablet [Pharmacy Med Name: ROSUVASTATIN 10MG  TABLETS] 90 tablet 3  ?  Sig: TAKE 1 TABLET(10 MG) BY MOUTH DAILY  ?  ? Cardiovascular:  Antilipid - Statins 2 Failed - 10/04/2021  8:08 AM  ?  ?  Failed - Lipid Panel in normal range within the last 12 months  ?  Cholesterol, Total  ?Date Value Ref Range Status  ?09/13/2021 159 100 - 199 mg/dL Final  ? ?LDL Chol Calc (NIH)  ?Date Value Ref Range Status  ?09/13/2021 70 0 - 99 mg/dL Final  ? ?HDL  ?Date Value Ref Range Status  ?09/13/2021 75 >39 mg/dL Final  ? ?Triglycerides  ?Date Value Ref Range Status  ?09/13/2021 73 0 - 149 mg/dL Final  ? ?  ?  ?  Passed - Cr in normal range and within 360 days  ?  Creatinine, Ser  ?Date Value Ref Range Status  ?09/13/2021 0.70 0.57 - 1.00 mg/dL Final  ?   ?  ?  Passed - Patient is not pregnant  ?  ?  Passed - Valid encounter within last 12 months  ?  Recent Outpatient Visits   ?      ? 3 weeks ago Encounter for annual wellness visit (AWV) in Medicare patient  ? [facility] [place], [place], [place]  ? 1 year ago Encounter for annual physical exam  ? [facility] [place], [facility], MD  ? 2 years ago Encounter for annual physical exam  ? [facility], [facility], MD  ? 3 years ago Encounter for annual physical exam  ? [facility], [facility], MD  ? 3 years ago Rash and nonspecific skin eruption  ? [facility] Bacigalupo, [facility], MD  ?  ?  ?Future Appointments   ?        ? In 11 months Bacigalupo, Marzella Schlein, MD [facility]  ?  ? ?  ?  ?  ? ? ?

## 2022-03-08 ENCOUNTER — Other Ambulatory Visit: Payer: Self-pay | Admitting: Family Medicine

## 2022-03-14 DIAGNOSIS — H2513 Age-related nuclear cataract, bilateral: Secondary | ICD-10-CM | POA: Diagnosis not present

## 2022-07-20 ENCOUNTER — Telehealth: Payer: Self-pay | Admitting: Family Medicine

## 2022-07-20 NOTE — Telephone Encounter (Signed)
Contacted Robin Stephens to schedule their annual wellness visit. Appointment made for 09/19/2022.  [place] Direct Dial: [phone]

## 2022-08-27 ENCOUNTER — Other Ambulatory Visit: Payer: Self-pay | Admitting: Family Medicine

## 2022-08-29 ENCOUNTER — Other Ambulatory Visit: Payer: Self-pay | Admitting: Family Medicine

## 2022-09-15 ENCOUNTER — Encounter: Payer: Medicare PPO | Admitting: Family Medicine

## 2022-09-15 ENCOUNTER — Telehealth: Payer: Self-pay | Admitting: Family Medicine

## 2022-09-15 DIAGNOSIS — E7800 Pure hypercholesterolemia, unspecified: Secondary | ICD-10-CM

## 2022-09-15 NOTE — Telephone Encounter (Signed)
[facility] pharmacy requesting prescription refill rosuvastatin (CRESTOR) 10 MG tablet   Please advise

## 2022-09-15 NOTE — Telephone Encounter (Signed)
Patient requesting refill too soon.

## 2022-09-19 ENCOUNTER — Ambulatory Visit (INDEPENDENT_AMBULATORY_CARE_PROVIDER_SITE_OTHER): Payer: Medicare PPO

## 2022-09-19 ENCOUNTER — Other Ambulatory Visit: Payer: Self-pay | Admitting: Family Medicine

## 2022-09-19 VITALS — Ht 61.0 in | Wt 110.0 lb

## 2022-09-19 DIAGNOSIS — Z0000 Encounter for general adult medical examination without abnormal findings: Secondary | ICD-10-CM | POA: Diagnosis not present

## 2022-09-19 DIAGNOSIS — E7800 Pure hypercholesterolemia, unspecified: Secondary | ICD-10-CM

## 2022-09-19 MED ORDER — ROSUVASTATIN CALCIUM 10 MG PO TABS: ORAL_TABLET | ORAL | 1 refills | Status: DC

## 2022-09-19 NOTE — Progress Notes (Signed)
I connected with  Robin Stephens on 09/19/22 by a audio enabled telemedicine application and verified that I am speaking with the correct person using two identifiers.  Patient Location: Home  Provider Location: Office/Clinic  I discussed the limitations of evaluation and management by telemedicine. The patient expressed understanding and agreed to proceed.  Subjective:   Robin Stephens is a 72 y.o. female who presents for Medicare Annual (Subsequent) preventive examination.  Review of Systems    Cardiac Risk Factors include: advanced age (>58men, >53 women);dyslipidemia     Objective:    Today's Vitals   09/19/22 1332 09/19/22 1333  Weight: 110 lb (49.9 kg)   Height: 5\' 1"  (1.549 m)   PainSc:  3    Body mass index is 20.78 kg/m.     09/19/2022    1:41 PM 08/31/2020    8:33 AM 08/20/2019   10:11 AM 07/11/2018    9:01 AM 07/11/2018    8:59 AM  Advanced Directives  Does Patient Have a Medical Advance Directive? Yes No No No Yes  Type of Merchandiser, retail of [place];Living will  Does patient want to make changes to medical advance directive?    Yes (MAU/Ambulatory/Procedural Areas - Information given)   Copy of Healthcare Power of Attorney in Chart?     No - copy requested  Would patient like information on creating a medical advance directive?  No - Patient declined No - Patient declined      Current Medications (verified) Outpatient Encounter Medications as of 09/19/2022  Medication Sig   calcium carbonate (OS-CAL) 600 MG TABS tablet Take 1,200 mg by mouth daily with breakfast.    Collagenase POWD by Does not apply route 2 (two) times daily. Unsure dose   escitalopram (LEXAPRO) 10 MG tablet TAKE 1 TABLET(10 MG) BY MOUTH DAILY   Ginger, Zingiber officinalis, 550 MG CAPS Take by mouth daily.    Glucosamine-Chondroitin (GLUCOSAMINE CHONDR COMPLEX PO) Take by mouth daily. Unsure dose   Lactobacillus (ACIDOPHILUS) 100 MG CAPS Take by mouth daily.     MULTIPLE VITAMINS PO Take by mouth daily.    Omega-3 Fatty Acids (FISH OIL) 1200 MG CAPS Take by mouth daily.    rosuvastatin (CRESTOR) 10 MG tablet TAKE 1 TABLET(10 MG) BY MOUTH DAILY   TURMERIC PO Take by mouth daily. Unsure dose   No facility-administered encounter medications on file as of 09/19/2022.    Allergies (verified) Sulfa antibiotics   History: Past Medical History:  Diagnosis Date   Allergy    Anxiety On occasion   Past Surgical History:  Procedure Laterality Date   APPENDECTOMY  2006   EYE SURGERY     PRK   Family History  Problem Relation Age of Onset   Heart disease Father    Obesity Sister    Cancer Sister    Breast cancer Sister 69       radiation; in remission   Breast cancer Mother 19   Cancer Mother    Healthy Sister    Social History   Socioeconomic History   Marital status: Married    Spouse name: Clide Cliff   Number of children: 2   Years of education: 15   Highest education level: Some college, no degree  Occupational History   Occupation: retired  Tobacco Use   Smoking status: Never   Smokeless tobacco: Never  Vaping Use   Vaping Use: Never used  Substance and Sexual Activity   Alcohol  use: Yes    Alcohol/week: 3.0 standard drinks of alcohol    Types: 3 Glasses of wine per week   Drug use: No   Sexual activity: Yes  Other Topics Concern   Not on file  Social History Narrative   Not on file   Social Determinants of Health   Financial Resource Strain: Low Risk  (09/15/2022)   Overall Financial Resource Strain (CARDIA)    Difficulty of Paying Living Expenses: Not hard at all  Food Insecurity: No Food Insecurity (09/15/2022)   Hunger Vital Sign    Worried About Running Out of Food in the Last Year: Never true    Ran Out of Food in the Last Year: Never true  Transportation Needs: No Transportation Needs (09/15/2022)   PRAPARE - Administrator, Civil Service (Medical): No    Lack of Transportation (Non-Medical): No   Physical Activity: Sufficiently Active (09/19/2022)   Exercise Vital Sign    Days of Exercise per Week: 6 days    Minutes of Exercise per Session: 30 min  Stress: No Stress Concern Present (09/15/2022)   [employer] of Occupational Health - Occupational Stress Questionnaire    Feeling of Stress : Only a little  Social Connections: Unknown (09/15/2022)   Social Connection and Isolation Panel [NHANES]    Frequency of Communication with Friends and Family: Patient declined    Frequency of Social Gatherings with Friends and Family: Patient declined    Attends Religious Services: Not on Marketing executive or Organizations: Patient declined    Attends Banker Meetings: Patient declined    Marital Status: Married    Tobacco Counseling Counseling given: Not Answered   Clinical Intake:  Pre-visit preparation completed: Yes  Pain : 0-10 Pain Score: 3  Pain Type: Chronic pain Pain Location: Back Pain Orientation: Left Pain Descriptors / Indicators: Aching Pain Onset: More than a month ago Pain Frequency: Intermittent Pain Relieving Factors: salon-spa pathes  Pain Relieving Factors: salon-spa pathes  BMI - recorded: 20.78 Nutritional Status: BMI of 19-24  Normal Nutritional Risks: None Diabetes: No  How often do you need to have someone help you when you read instructions, pamphlets, or other written materials from your doctor or pharmacy?: 1 - Never  Diabetic?yes  Interpreter Needed?: No  Comments: lives with husband Information entered by :: B.Jackqueline Aquilar,LPN   Activities of Daily Living    09/15/2022    3:02 PM  In your present state of health, do you have any difficulty performing the following activities:  Hearing? 0  Vision? 0  Difficulty concentrating or making decisions? 0  Walking or climbing stairs? 0  Dressing or bathing? 0  Doing errands, shopping? 0  Preparing Food and eating ? N  Using the Toilet? N  In the past six months,  have you accidently leaked urine? N  Do you have problems with loss of bowel control? N  Managing your Medications? N  Managing your Finances? N  Housekeeping or managing your Housekeeping? N    Patient Care Team: Erasmo Downer, MD as PCP - General (Family Medicine) [facility], [facility]  Indicate any recent Medical Services you may have received from other than [facility] providers in the past year (date may be approximate).     Assessment:   This is a routine wellness examination for [place].  Hearing/Vision screen Hearing Screening - Comments:: Adequate hearing Vision Screening - Comments:: [place] vision w/glasses/contacts [facility]  Dietary  issues and exercise activities discussed: Current Exercise Habits: Home exercise routine, Type of exercise: strength training/weights;stretching;treadmill;walking, Time (Minutes): 30, Frequency (Times/Week): 6, Weekly Exercise (Minutes/Week): 180, Intensity: Mild, Exercise limited by: None identified   Goals Addressed             This Visit's Progress    DIET - INCREASE WATER INTAKE   Not on track    Recommend to drink at least 6-8 8oz glasses of water per day.       Depression Screen    09/19/2022    1:38 PM 09/13/2021    8:46 AM 08/31/2020    8:29 AM 08/20/2019   10:05 AM 07/11/2018    9:00 AM 07/10/2017   10:27 AM 06/28/2016   11:53 AM  PHQ 2/9 Scores  PHQ - 2 Score 0 0 0 0 0 0 0  PHQ- 9 Score  0         Fall Risk    09/15/2022    3:02 PM 09/13/2021    8:46 AM 09/12/2021    7:36 PM 08/31/2020    8:26 AM 08/20/2019   10:12 AM  Fall Risk   Falls in the past year? 0 0 0 0 0  Number falls in past yr: 0 0  0 0  Injury with Fall? 0 0  0 0  Risk for fall due to :  No Fall Risks     Follow up  Falls evaluation completed       FALL RISK PREVENTION PERTAINING TO THE HOME:  Any stairs in or around the home? Yes  If so, are there any without handrails? Yes  Home free of loose throw rugs in walkways, pet beds,  electrical cords, etc? Yes  Adequate lighting in your home to reduce risk of falls? Yes   ASSISTIVE DEVICES UTILIZED TO PREVENT FALLS:  Life alert? No  Use of a cane, walker or w/c? No  Grab bars in the bathroom? No  Shower chair or bench in shower? No  Elevated toilet seat or a handicapped toilet? No    Cognitive Function:        09/19/2022    1:42 PM  6CIT Screen  What Year? 0 points  What month? 0 points  What time? 0 points  Count back from 20 0 points  Months in reverse 0 points  Repeat phrase 0 points  Total Score 0 points    Immunizations Immunization History  Administered Date(s) Administered   Fluad Quad(high Dose 65+) 03/12/2019   Influenza, High Dose Seasonal PF 03/06/2018   Influenza,inj,Quad PF,6+ Mos 02/27/2017   Moderna Sars-Covid-2 Vaccination 07/12/2019, 08/09/2019, 03/21/2020   Pneumococcal Conjugate-13 07/10/2017   Pneumococcal Polysaccharide-23 07/11/2018   Td 10/17/2003   Tdap 01/01/2013   Zoster Recombinat (Shingrix) 12/05/2017, 02/06/2018    TDAP status: Up to date  Flu Vaccine status: Up to date  Pneumococcal vaccine status: Up to date  Covid-19 vaccine status: Completed vaccines  Qualifies for Shingles Vaccine? Yes   Zostavax completed Yes   Shingrix Completed?: Yes  Screening Tests Health Maintenance  Topic Date Due   COVID-19 Vaccine (4 - 2023-24 season) 01/28/2022   DEXA SCAN  08/09/2022   INFLUENZA VACCINE  12/29/2022   DTaP/Tdap/Td (3 - Td or Tdap) 01/02/2023   Medicare Annual Wellness (AWV)  09/19/2023   MAMMOGRAM  10/02/2023   COLONOSCOPY (Pts 45-2yrs Insurance coverage will need to be confirmed)  09/24/2024   Pneumonia Vaccine 69+ Years old  Completed   Hepatitis C  Screening  Completed   Zoster Vaccines- Shingrix  Completed   HPV VACCINES  Aged Out    Health Maintenance  Health Maintenance Due  Topic Date Due   COVID-19 Vaccine (4 - 2023-24 season) 01/28/2022   DEXA SCAN  08/09/2022    Colorectal cancer  screening: Type of screening: Colonoscopy. Completed yes. Repeat every 10 years  Mammogram status: Ordered yes. Pt provided with contact info and advised to call to schedule appt.   Bone Density status: Ordered yes. Pt provided with contact info and advised to call to schedule appt.  Lung Cancer Screening: (Low Dose CT Chest recommended if Age 63-80 years, 30 pack-year currently smoking OR have quit w/in 15years.) does not qualify.   Lung Cancer Screening Referral: no  Additional Screening:  Hepatitis C Screening: does not qualify; Completed yes  Vision Screening: Recommended annual ophthalmology exams for early detection of glaucoma and other disorders of the eye. Is the patient up to date with their annual eye exam?  Yes  Who is the provider or what is the name of the office in which the patient attends annual eye exams? [facility] If pt is not established with a provider, would they like to be referred to a provider to establish care? No .   Dental Screening: Recommended annual dental exams for proper oral hygiene  Community Resource Referral / Chronic Care Management: CRR required this visit?  No   CCM required this visit?  No      Plan:     I have personally reviewed and noted the following in the patient's chart:   Medical and social history Use of alcohol, tobacco or illicit drugs  Current medications and supplements including opioid prescriptions. Patient is not currently taking opioid prescriptions. Functional ability and status Nutritional status Physical activity Advanced directives List of other physicians Hospitalizations, surgeries, and ER visits in previous 12 months Vitals Screenings to include cognitive, depression, and falls Referrals and appointments  In addition, I have reviewed and discussed with patient certain preventive protocols, quality metrics, and best practice recommendations. A written personalized care plan for preventive services as well  as general preventive health recommendations were provided to patient.     Sue Lush, LPN   06/17/1476   Nurse Notes: The patient states she is doing well and has no concerns or questions at this time.  *She does relay she needs a refill of Crestor

## 2022-09-19 NOTE — Patient Instructions (Signed)
Robin Stephens , Thank you for taking time to come for your Medicare Wellness Visit. I appreciate your ongoing commitment to your health goals. Please review the following plan we discussed and let me know if I can assist you in the future.   These are the goals we discussed:  Goals      DIET - INCREASE WATER INTAKE     Recommend to drink at least 6-8 8oz glasses of water per day.        This is a list of the screening recommended for you and due dates:  Health Maintenance  Topic Date Due   COVID-19 Vaccine (4 - 2023-24 season) 01/28/2022   DEXA scan (bone density measurement)  08/09/2022   Flu Shot  12/29/2022   DTaP/Tdap/Td vaccine (3 - Td or Tdap) 01/02/2023   Medicare Annual Wellness Visit  09/19/2023   Mammogram  10/02/2023   Colon Cancer Screening  09/24/2024   Pneumonia Vaccine  Completed   Hepatitis C Screening: USPSTF Recommendation to screen - Ages 18-79 yo.  Completed   Zoster (Shingles) Vaccine  Completed   HPV Vaccine  Aged Out    Advanced directives: yes  Conditions/risks identified: none  Next appointment: Follow up in one year for your annual wellness visit 09/20/2023 :30pm telephone   Preventive Care 65 Years and Older, Female Preventive care refers to lifestyle choices and visits with your health care provider that can promote health and wellness. What does preventive care include? A yearly physical exam. This is also called an annual well check. Dental exams once or twice a year. Routine eye exams. Ask your health care provider how often you should have your eyes checked. Personal lifestyle choices, including: Daily care of your teeth and gums. Regular physical activity. Eating a healthy diet. Avoiding tobacco and drug use. Limiting alcohol use. Practicing safe sex. Taking low-dose aspirin every day. Taking vitamin and mineral supplements as recommended by your health care provider. What happens during an annual well check? The services and  screenings done by your health care provider during your annual well check will depend on your age, overall health, lifestyle risk factors, and family history of disease. Counseling  Your health care provider may ask you questions about your: Alcohol use. Tobacco use. Drug use. Emotional well-being. Home and relationship well-being. Sexual activity. Eating habits. History of falls. Memory and ability to understand (cognition). Work and work Astronomer. Reproductive health. Screening  You may have the following tests or measurements: Height, weight, and BMI. Blood pressure. Lipid and cholesterol levels. These may be checked every 5 years, or more frequently if you are over 71 years old. Skin check. Lung cancer screening. You may have this screening every year starting at age 82 if you have a 30-pack-year history of smoking and currently smoke or have quit within the past 15 years. Fecal occult blood test (FOBT) of the stool. You may have this test every year starting at age 81. Flexible sigmoidoscopy or colonoscopy. You may have a sigmoidoscopy every 5 years or a colonoscopy every 10 years starting at age 77. Hepatitis C blood test. Hepatitis B blood test. Sexually transmitted disease (STD) testing. Diabetes screening. This is done by checking your blood sugar (glucose) after you have not eaten for a while (fasting). You may have this done every 1-3 years. Bone density scan. This is done to screen for osteoporosis. You may have this done starting at age 51. Mammogram. This may be done every 1-2 years. Talk to your  health care provider about how often you should have regular mammograms. Talk with your health care provider about your test results, treatment options, and if necessary, the need for more tests. Vaccines  Your health care provider may recommend certain vaccines, such as: Influenza vaccine. This is recommended every year. Tetanus, diphtheria, and acellular pertussis (Tdap,  Td) vaccine. You may need a Td booster every 10 years. Zoster vaccine. You may need this after age 64. Pneumococcal 13-valent conjugate (PCV13) vaccine. One dose is recommended after age 60. Pneumococcal polysaccharide (PPSV23) vaccine. One dose is recommended after age 9. Talk to your health care provider about which screenings and vaccines you need and how often you need them. This information is not intended to replace advice given to you by your health care provider. Make sure you discuss any questions you have with your health care provider. Document Released: 06/12/2015 Document Revised: 02/03/2016 Document Reviewed: 03/17/2015 Elsevier Interactive Patient Education  2017 [place] Prevention in the Home Falls can cause injuries. They can happen to people of all ages. There are many things you can do to make your home safe and to help prevent falls. What can I do on the outside of my home? Regularly fix the edges of walkways and driveways and fix any cracks. Remove anything that might make you trip as you walk through a door, such as a raised step or threshold. Trim any bushes or trees on the path to your home. Use bright outdoor lighting. Clear any walking paths of anything that might make someone trip, such as rocks or tools. Regularly check to see if handrails are loose or broken. Make sure that both sides of any steps have handrails. Any raised decks and porches should have guardrails on the edges. Have any leaves, snow, or ice cleared regularly. Use sand or salt on walking paths during winter. Clean up any spills in your garage right away. This includes oil or grease spills. What can I do in the bathroom? Use night lights. Install grab bars by the toilet and in the tub and shower. Do not use towel bars as grab bars. Use non-skid mats or decals in the tub or shower. If you need to sit down in the shower, use a plastic, non-slip stool. Keep the floor dry. Clean up any  water that spills on the floor as soon as it happens. Remove soap buildup in the tub or shower regularly. Attach bath mats securely with double-sided non-slip rug tape. Do not have throw rugs and other things on the floor that can make you trip. What can I do in the bedroom? Use night lights. Make sure that you have a light by your bed that is easy to reach. Do not use any sheets or blankets that are too big for your bed. They should not hang down onto the floor. Have a firm chair that has side arms. You can use this for support while you get dressed. Do not have throw rugs and other things on the floor that can make you trip. What can I do in the kitchen? Clean up any spills right away. Avoid walking on wet floors. Keep items that you use a lot in easy-to-reach places. If you need to reach something above you, use a strong step stool that has a grab bar. Keep electrical cords out of the way. Do not use floor polish or wax that makes floors slippery. If you must use wax, use non-skid floor wax. Do not have  throw rugs and other things on the floor that can make you trip. What can I do with my stairs? Do not leave any items on the stairs. Make sure that there are handrails on both sides of the stairs and use them. Fix handrails that are broken or loose. Make sure that handrails are as long as the stairways. Check any carpeting to make sure that it is firmly attached to the stairs. Fix any carpet that is loose or worn. Avoid having throw rugs at the top or bottom of the stairs. If you do have throw rugs, attach them to the floor with carpet tape. Make sure that you have a light switch at the top of the stairs and the bottom of the stairs. If you do not have them, ask someone to add them for you. What else can I do to help prevent falls? Wear shoes that: Do not have high heels. Have rubber bottoms. Are comfortable and fit you well. Are closed at the toe. Do not wear sandals. If you use a  stepladder: Make sure that it is fully opened. Do not climb a closed stepladder. Make sure that both sides of the stepladder are locked into place. Ask someone to hold it for you, if possible. Clearly mark and make sure that you can see: Any grab bars or handrails. First and last steps. Where the edge of each step is. Use tools that help you move around (mobility aids) if they are needed. These include: Canes. Walkers. Scooters. Crutches. Turn on the lights when you go into a dark area. Replace any light bulbs as soon as they burn out. Set up your furniture so you have a clear path. Avoid moving your furniture around. If any of your floors are uneven, fix them. If there are any pets around you, be aware of where they are. Review your medicines with your doctor. Some medicines can make you feel dizzy. This can increase your chance of falling. Ask your doctor what other things that you can do to help prevent falls. This information is not intended to replace advice given to you by your health care provider. Make sure you discuss any questions you have with your health care provider. Document Released: 03/12/2009 Document Revised: 10/22/2015 Document Reviewed: 06/20/2014 Elsevier Interactive Patient Education  2017 [employer].

## 2022-09-20 ENCOUNTER — Encounter: Payer: Medicare PPO | Admitting: Family Medicine

## 2022-09-21 NOTE — Progress Notes (Signed)
I,Joseline E Rosas,acting as a scribe for Shirlee Latch, MD.,have documented all relevant documentation on the behalf of Shirlee Latch, MD,as directed by  Shirlee Latch, MD while in the presence of Shirlee Latch, MD.    Complete physical exam   Patient: Robin Stephens   DOB: 1950-07-25   72 y.o. Female  MRN: [id number] Visit Date: 09/23/2022  Today's healthcare provider: Shirlee Latch, MD   Chief Complaint  Patient presents with   Annual Exam   Subjective    Robin Stephens is a 72 y.o. female who presents today for a complete physical exam.  She reports consuming a  healthy  diet. Home exercise routine includes elliptical 30 minutes 6 days a week. She generally feels well. She reports sleeping well. She does have additional problems to discuss today.  HPI   AWV 09/19/2022  Has pain in L buttocks x1-2 months. No joint pain, no radiation - small knot - will try heat and massage - consider trigger point injection in the future  Past Medical History:  Diagnosis Date   Allergy    Anxiety On occasion   Past Surgical History:  Procedure Laterality Date   APPENDECTOMY  2006   EYE SURGERY     PRK   Social History   Socioeconomic History   Marital status: Married    Spouse name: Ricky   Number of children: 2   Years of education: 15   Highest education level: Some college, no degree  Occupational History   Occupation: retired  Tobacco Use   Smoking status: Never   Smokeless tobacco: Never  Vaping Use   Vaping Use: Never used  Substance and Sexual Activity   Alcohol use: Yes    Alcohol/week: 3.0 standard drinks of alcohol    Types: 3 Glasses of wine per week   Drug use: No   Sexual activity: Yes  Other Topics Concern   Not on file  Social History Narrative   Not on file   Social Determinants of Health   Financial Resource Strain: Low Risk  (09/15/2022)   Overall Financial Resource Strain (CARDIA)    Difficulty of Paying Living Expenses: Not  hard at all  Food Insecurity: No Food Insecurity (09/15/2022)   Hunger Vital Sign    Worried About Running Out of Food in the Last Year: Never true    Ran Out of Food in the Last Year: Never true  Transportation Needs: No Transportation Needs (09/15/2022)   PRAPARE - Administrator, Civil Service (Medical): No    Lack of Transportation (Non-Medical): No  Physical Activity: Sufficiently Active (09/19/2022)   Exercise Vital Sign    Days of Exercise per Week: 6 days    Minutes of Exercise per Session: 30 min  Stress: No Stress Concern Present (09/15/2022)   [employer] of Occupational Health - Occupational Stress Questionnaire    Feeling of Stress : Only a little  Social Connections: Unknown (09/15/2022)   Social Connection and Isolation Panel [NHANES]    Frequency of Communication with Friends and Family: Patient declined    Frequency of Social Gatherings with Friends and Family: Patient declined    Attends Religious Services: Not on Insurance claims handler of Clubs or Organizations: Patient declined    Attends Banker Meetings: Patient declined    Marital Status: Married  Catering manager Violence: Not At Risk (09/19/2022)   Humiliation, Afraid, Rape, and Kick questionnaire    Fear of Current or  Ex-Partner: No    Emotionally Abused: No    Physically Abused: No    Sexually Abused: No   Family Status  Relation Name Status   Father Reade Deceased   Sister Joy Alive   Mother Lottie Deceased   Sister  Alive   Sister  Deceased       MVA   Family History  Problem Relation Age of Onset   Heart disease Father    Obesity Sister    Cancer Sister    Breast cancer Sister 19       radiation; in remission   Breast cancer Mother 9   Cancer Mother    Healthy Sister    Allergies  Allergen Reactions   Sulfa Antibiotics Rash    Patient Care Team: Erasmo Downer, MD as PCP - General (Family Medicine) [facility], [facility]    Medications: Outpatient Medications Prior to Visit  Medication Sig   calcium carbonate (OS-CAL) 600 MG TABS tablet Take 1,200 mg by mouth daily with breakfast.    Collagenase POWD by Does not apply route 2 (two) times daily. Unsure dose   escitalopram (LEXAPRO) 10 MG tablet TAKE 1 TABLET(10 MG) BY MOUTH DAILY   Ginger, Zingiber officinalis, 550 MG CAPS Take by mouth daily.    Glucosamine-Chondroitin (GLUCOSAMINE CHONDR COMPLEX PO) Take by mouth daily. Unsure dose   Lactobacillus (ACIDOPHILUS) 100 MG CAPS Take by mouth daily.    MULTIPLE VITAMINS PO Take by mouth daily.    Omega-3 Fatty Acids (FISH OIL) 1200 MG CAPS Take by mouth daily.    rosuvastatin (CRESTOR) 10 MG tablet Take one 10 mg tablet by mouth daily   TURMERIC PO Take by mouth daily. Unsure dose   No facility-administered medications prior to visit.    Review of Systems  Musculoskeletal:  Positive for myalgias.  All other systems reviewed and are negative.     Objective    BP 124/79 (BP Location: Left Arm, Patient Position: Sitting, Cuff Size: Normal)   Pulse (!) 56   Resp 16   Ht 5\' 1"  (1.549 m)   Wt 111 lb 6.4 oz (50.5 kg)   BMI 21.05 kg/m     Physical Exam Vitals reviewed.  Constitutional:      General: She is not in acute distress.    Appearance: Normal appearance. She is well-developed. She is not diaphoretic.  HENT:     Head: Normocephalic and atraumatic.     Right Ear: Tympanic membrane, ear canal and external ear normal.     Left Ear: Tympanic membrane, ear canal and external ear normal.     Nose: Nose normal.     Mouth/Throat:     Mouth: Mucous membranes are moist.     Pharynx: Oropharynx is clear. No oropharyngeal exudate.  Eyes:     General: No scleral icterus.    Conjunctiva/sclera: Conjunctivae normal.     Pupils: Pupils are equal, round, and reactive to light.  Neck:     Thyroid: No thyromegaly.  Cardiovascular:     Rate and Rhythm: Normal rate and regular rhythm.     Heart sounds:  Normal heart sounds. No murmur heard. Pulmonary:     Effort: Pulmonary effort is normal. No respiratory distress.     Breath sounds: Normal breath sounds. No wheezing or rales.  Abdominal:     General: There is no distension.     Palpations: Abdomen is soft.     Tenderness: There is no abdominal tenderness.  Musculoskeletal:        General: No deformity.     Cervical back: Neck supple.     Right lower leg: No edema.     Left lower leg: No edema.  Lymphadenopathy:     Cervical: No cervical adenopathy.  Skin:    General: Skin is warm and dry.     Findings: No rash.  Neurological:     Mental Status: She is alert and oriented to person, place, and time. Mental status is at baseline.     Gait: Gait normal.  Psychiatric:        Mood and Affect: Mood normal.        Behavior: Behavior normal.        Thought Content: Thought content normal.       Last depression screening scores    09/19/2022    1:38 PM 09/13/2021    8:46 AM 08/31/2020    8:29 AM  PHQ 2/9 Scores  PHQ - 2 Score 0 0 0  PHQ- 9 Score  0    Last fall risk screening    09/15/2022    3:02 PM  Fall Risk   Falls in the past year? 0  Number falls in past yr: 0  Injury with Fall? 0   Last Audit-C alcohol use screening    09/15/2022    3:02 PM  Alcohol Use Disorder Test (AUDIT)  1. How often do you have a drink containing alcohol? 2  2. How many drinks containing alcohol do you have on a typical day when you are drinking? 0  3. How often do you have six or more drinks on one occasion? 0  AUDIT-C Score 2   A score of 3 or more in women, and 4 or more in men indicates increased risk for alcohol abuse, EXCEPT if all of the points are from question 1   No results found for any visits on 09/23/22.  Assessment & Plan    Routine Health Maintenance and Physical Exam  Exercise Activities and Dietary recommendations  Goals      DIET - INCREASE WATER INTAKE     Recommend to drink at least 6-8 8oz glasses of water per  day.        Immunization History  Administered Date(s) Administered   Fluad Quad(high Dose 65+) 03/12/2019   Influenza, High Dose Seasonal PF 03/06/2018   Influenza,inj,Quad PF,6+ Mos 02/27/2017   Moderna Sars-Covid-2 Vaccination 07/12/2019, 08/09/2019, 03/21/2020   Pneumococcal Conjugate-13 07/10/2017   Pneumococcal Polysaccharide-23 07/11/2018   Td 10/17/2003   Tdap 01/01/2013   Zoster Recombinat (Shingrix) 12/05/2017, 02/06/2018    Health Maintenance  Topic Date Due   COVID-19 Vaccine (4 - 2023-24 season) 01/28/2022   DEXA SCAN  08/09/2022   INFLUENZA VACCINE  12/29/2022   DTaP/Tdap/Td (3 - Td or Tdap) 01/02/2023   Medicare Annual Wellness (AWV)  09/19/2023   MAMMOGRAM  10/02/2023   COLONOSCOPY (Pts 45-42yrs Insurance coverage will need to be confirmed)  09/24/2024   Pneumonia Vaccine 46+ Years old  Completed   Hepatitis C Screening  Completed   Zoster Vaccines- Shingrix  Completed   HPV VACCINES  Aged Out    Discussed health benefits of physical activity, and encouraged her to engage in regular exercise appropriate for her age and condition.  Problem List Items Addressed This Visit       Other   Anxiety    Stable and well controlled Continue lexapro      Hypercholesteremia  Previously slightly elevated Started statin after CT cardiac Ca score high Continue statin Repeat FLP and CMP      Relevant Orders   Comprehensive metabolic panel   Lipid panel   Insomnia    Well controlled 2/2 anxiety Continue lexapro      Other Visit Diagnoses     Encounter for annual physical exam    -  Primary   Relevant Orders   Comprehensive metabolic panel   Lipid panel   Postmenopausal       Relevant Orders   DG Bone Density   Breast cancer screening by mammogram       Relevant Orders   MM 3D SCREENING MAMMOGRAM BILATERAL BREAST        Return in about 1 year (around 09/23/2023) for CPE.     I, Shirlee Latch, MD, have reviewed all documentation for  this visit. The documentation on 09/23/22 for the exam, diagnosis, procedures, and orders are all accurate and complete.   Lashawn Orrego, Marzella Schlein, MD, MPH [facility]

## 2022-09-23 ENCOUNTER — Encounter: Payer: Self-pay | Admitting: Family Medicine

## 2022-09-23 ENCOUNTER — Ambulatory Visit (INDEPENDENT_AMBULATORY_CARE_PROVIDER_SITE_OTHER): Payer: Medicare PPO | Admitting: Family Medicine

## 2022-09-23 VITALS — BP 124/79 | HR 56 | Resp 16 | Ht 61.0 in | Wt 111.4 lb

## 2022-09-23 DIAGNOSIS — Z780 Asymptomatic menopausal state: Secondary | ICD-10-CM | POA: Diagnosis not present

## 2022-09-23 DIAGNOSIS — E7800 Pure hypercholesterolemia, unspecified: Secondary | ICD-10-CM | POA: Diagnosis not present

## 2022-09-23 DIAGNOSIS — F419 Anxiety disorder, unspecified: Secondary | ICD-10-CM | POA: Diagnosis not present

## 2022-09-23 DIAGNOSIS — Z0000 Encounter for general adult medical examination without abnormal findings: Secondary | ICD-10-CM

## 2022-09-23 DIAGNOSIS — F5104 Psychophysiologic insomnia: Secondary | ICD-10-CM

## 2022-09-23 DIAGNOSIS — Z1231 Encounter for screening mammogram for malignant neoplasm of breast: Secondary | ICD-10-CM

## 2022-09-23 NOTE — Assessment & Plan Note (Signed)
Stable and well controlled ?Continue lexapro ?

## 2022-09-23 NOTE — Assessment & Plan Note (Signed)
Previously slightly elevated ?Started statin after CT cardiac Ca score high ?Continue statin ?Repeat FLP and CMP ?

## 2022-09-23 NOTE — Assessment & Plan Note (Signed)
Well controlled 2/2 anxiety Continue lexapro 

## 2022-09-23 NOTE — Patient Instructions (Signed)
Recommend getting tetanus vaccine at the pharmacy in August

## 2022-09-24 LAB — COMPREHENSIVE METABOLIC PANEL
ALT: 22 IU/L (ref 0–32)
AST: 22 IU/L (ref 0–40)
Albumin/Globulin Ratio: 1.9 (ref 1.2–2.2)
Albumin: 4.4 g/dL (ref 3.9–4.9)
Alkaline Phosphatase: 72 IU/L (ref 44–121)
BUN/Creatinine Ratio: 48 — ABNORMAL HIGH (ref 12–28)
BUN: 29 mg/dL — ABNORMAL HIGH (ref 8–27)
Bilirubin Total: 0.4 mg/dL (ref 0.0–1.2)
CO2: 21 mmol/L (ref 20–29)
Calcium: 9.1 mg/dL (ref 8.7–10.3)
Chloride: 103 mmol/L (ref 96–106)
Creatinine, Ser: 0.61 mg/dL (ref 0.57–1.00)
Globulin, Total: 2.3 g/dL (ref 1.5–4.5)
Glucose: 89 mg/dL (ref 70–99)
Potassium: 4.4 mmol/L (ref 3.5–5.2)
Sodium: 142 mmol/L (ref 134–144)
Total Protein: 6.7 g/dL (ref 6.0–8.5)
eGFR: 96 mL/min/{1.73_m2} (ref >59)

## 2022-09-24 LAB — LIPID PANEL
Chol/HDL Ratio: 1.9 ratio (ref 0.0–4.4)
Cholesterol, Total: 187 mg/dL (ref 100–199)
HDL: 101 mg/dL (ref >39)
LDL Chol Calc (NIH): 74 mg/dL (ref 0–99)
Triglycerides: 62 mg/dL (ref 0–149)
VLDL Cholesterol Cal: 12 mg/dL (ref 5–40)

## 2022-10-06 ENCOUNTER — Telehealth: Payer: Self-pay

## 2022-10-06 DIAGNOSIS — M79605 Pain in left leg: Secondary | ICD-10-CM

## 2022-10-06 NOTE — Telephone Encounter (Signed)
Lmtcb to let us know about referral to ortho or PT.

## 2022-10-06 NOTE — Telephone Encounter (Signed)
Copied from CRM [id number]. Topic: Referral - Request for Referral >> Oct 06, 2022  9:01 AM Everette C wrote: Has patient seen PCP for this complaint? Yes.   *If NO, is insurance requiring patient see PCP for this issue before PCP can refer them? Referral for which specialty: Orthopedics  Preferred provider/office: [facility]  Reason for referral: upper left leg concern, patient would like to have dry needling

## 2022-10-06 NOTE — Telephone Encounter (Signed)
Ok to place referral for thigh pain. If she only wants dry needling then she needs PT, not Ortho though. Can we clarify this and send to PT or Ortho

## 2022-10-06 NOTE — Telephone Encounter (Signed)
Pt called, LVMTCB to discuss message from Dr. B for more information.   Ok to place referral for thigh pain. If she only wants dry needling then she needs PT, not Ortho though. Can we clarify this and send to PT or Ortho

## 2022-10-07 NOTE — Addendum Note (Signed)
Addended by: Hyacinth Meeker on: 10/07/2022 01:27 PM   Modules accepted: Orders

## 2022-10-07 NOTE — Telephone Encounter (Signed)
Pt returned call about referral for ortho, she says she doesn't need the PT just the dry needling because she was told by Dr B that this will help with there thigh area.

## 2022-10-10 ENCOUNTER — Encounter: Payer: Medicare PPO | Admitting: Family Medicine

## 2022-10-26 ENCOUNTER — Ambulatory Visit
Admission: RE | Admit: 2022-10-26 | Discharge: 2022-10-26 | Disposition: A | Discharge status: Home / Self Care | Payer: Medicare PPO | Source: Ambulatory Visit | Attending: Family Medicine | Admitting: Family Medicine

## 2022-10-26 DIAGNOSIS — M810 Age-related osteoporosis without current pathological fracture: Secondary | ICD-10-CM | POA: Diagnosis not present

## 2022-10-26 DIAGNOSIS — Z1231 Encounter for screening mammogram for malignant neoplasm of breast: Secondary | ICD-10-CM | POA: Insufficient documentation

## 2022-10-26 DIAGNOSIS — Z780 Asymptomatic menopausal state: Secondary | ICD-10-CM | POA: Diagnosis not present

## 2022-10-28 NOTE — Progress Notes (Deleted)
      Established patient visit   Patient: Robin Stephens   DOB: 03-May-1951   72 y.o. Female  MRN: [id number] Visit Date: 10/31/2022  Today's healthcare provider: Shirlee Latch, MD   No chief complaint on file.  Subjective    HPI  Follow up for osteoporosis  The patient was last seen for this 1 weeks ago. Changes made at last visit include advised to start weight bearing exercise. Start adequate Ca and vitamin d via diet or supplement. Patient advised to consider medication.  -----------------------------------------------------------------------------------------   Medications: Outpatient Medications Prior to Visit  Medication Sig   calcium carbonate (OS-CAL) 600 MG TABS tablet Take 1,200 mg by mouth daily with breakfast.    Collagenase POWD by Does not apply route 2 (two) times daily. Unsure dose   escitalopram (LEXAPRO) 10 MG tablet TAKE 1 TABLET(10 MG) BY MOUTH DAILY   Ginger, Zingiber officinalis, 550 MG CAPS Take by mouth daily.    Glucosamine-Chondroitin (GLUCOSAMINE CHONDR COMPLEX PO) Take by mouth daily. Unsure dose   Lactobacillus (ACIDOPHILUS) 100 MG CAPS Take by mouth daily.    MULTIPLE VITAMINS PO Take by mouth daily.    Omega-3 Fatty Acids (FISH OIL) 1200 MG CAPS Take by mouth daily.    rosuvastatin (CRESTOR) 10 MG tablet Take one 10 mg tablet by mouth daily   TURMERIC PO Take by mouth daily. Unsure dose   No facility-administered medications prior to visit.    Review of Systems  {Labs  Heme  Chem  Endocrine  Serology  Results Review (optional):23779}   Objective    There were no vitals taken for this visit. {Show previous vital signs (optional):23777}  Physical Exam  ***  No results found for any visits on 10/31/22.  Assessment & Plan     ***  No follow-ups on file.      {provider attestation***:1}   Shirlee Latch, MD  [facility] [phone] (phone) [phone] (fax)  [facility]

## 2022-10-31 ENCOUNTER — Ambulatory Visit: Payer: Medicare PPO | Admitting: Family Medicine

## 2022-11-03 ENCOUNTER — Ambulatory Visit: Payer: Medicare PPO | Admitting: Family Medicine

## 2022-11-03 ENCOUNTER — Encounter: Payer: Self-pay | Admitting: Family Medicine

## 2022-11-03 VITALS — BP 120/80 | HR 59

## 2022-11-03 DIAGNOSIS — M810 Age-related osteoporosis without current pathological fracture: Secondary | ICD-10-CM

## 2022-11-03 DIAGNOSIS — K219 Gastro-esophageal reflux disease without esophagitis: Secondary | ICD-10-CM | POA: Diagnosis not present

## 2022-11-03 HISTORY — DX: Age-related osteoporosis without current pathological fracture: M81.0

## 2022-11-03 HISTORY — DX: Gastro-esophageal reflux disease without esophagitis: K21.9

## 2022-11-03 MED ORDER — ALENDRONATE SODIUM 70 MG PO TABS: 70.0000 mg | ORAL_TABLET | ORAL | 11 refills | Status: DC

## 2022-11-03 NOTE — Progress Notes (Signed)
Established patient visit   Patient: Robin Stephens   DOB: 20-Sep-1950   72 y.o. Female  MRN: [id number] Visit Date: 11/03/2022  Today's healthcare provider: Shirlee Latch, MD   Chief Complaint  Patient presents with   Osteoporosis   Subjective    HPI  Discussed the use of AI scribe software for clinical note transcription with the patient, who gave verbal consent to proceed.  History of Present Illness   The patient, with a history of osteopenia, came in to discuss recent bone density scan results. The scan, conducted on May 29th, 2024, showed progression from osteopenia to osteoporosis in her arm and hip. The patient has been taking calcium and vitamin D supplements, and has been active, exercising six days a week and adding weights to her routine. The patient also mentioned experiencing indigestion, which she has been managing with omeprazole, and hip pain, for which she is seeking a referral to [facility].       Medications: Outpatient Medications Prior to Visit  Medication Sig   calcium carbonate (OS-CAL) 600 MG TABS tablet Take 1,200 mg by mouth daily with breakfast.    Collagenase POWD by Does not apply route 2 (two) times daily. Unsure dose   escitalopram (LEXAPRO) 10 MG tablet TAKE 1 TABLET(10 MG) BY MOUTH DAILY   Ginger, Zingiber officinalis, 550 MG CAPS Take by mouth daily.    Glucosamine-Chondroitin (GLUCOSAMINE CHONDR COMPLEX PO) Take by mouth daily. Unsure dose   Lactobacillus (ACIDOPHILUS) 100 MG CAPS Take by mouth daily.    MULTIPLE VITAMINS PO Take by mouth daily.    Omega-3 Fatty Acids (FISH OIL) 1200 MG CAPS Take by mouth daily.    rosuvastatin (CRESTOR) 10 MG tablet Take one 10 mg tablet by mouth daily   TURMERIC PO Take by mouth daily. Unsure dose   No facility-administered medications prior to visit.    Review of Systems per HPI     Objective    BP 120/80 Comment: home reading  Pulse (!) 59   SpO2 100%    Physical Exam Vitals  reviewed.  Constitutional:      General: She is not in acute distress.    Appearance: She is well-developed.  HENT:     Head: Normocephalic and atraumatic.  Eyes:     General: No scleral icterus.    Conjunctiva/sclera: Conjunctivae normal.  Cardiovascular:     Rate and Rhythm: Normal rate and regular rhythm.  Pulmonary:     Effort: Pulmonary effort is normal. No respiratory distress.  Abdominal:     General: There is no distension.     Palpations: Abdomen is soft.     Tenderness: There is no abdominal tenderness.  Skin:    General: Skin is warm and dry.     Findings: No rash.  Neurological:     Mental Status: She is alert and oriented to person, place, and time.  Psychiatric:        Behavior: Behavior normal.       No results found for any visits on 11/03/22.  Assessment & Plan     Problem List Items Addressed This Visit       Digestive   Gastroesophageal reflux disease without esophagitis     Musculoskeletal and Integument   Age-related osteoporosis without current pathological fracture - Primary   Relevant Medications   alendronate (FOSAMAX) 70 MG tablet        Osteoporosis: Progression from osteopenia to osteoporosis noted on recent bone  density scan. Discussed the risk of fragility fractures and the importance of maintaining bone health. Patient is already taking calcium and vitamin D supplements and engaging in weight-bearing exercise. -Start Fosamax once weekly. -Continue calcium (1200mg  daily) and vitamin D (1000-2000 IU daily) supplementation. -Continue weight-bearing exercise. -Plan to recheck bone density in 2 years.  Gastroesophageal Reflux Disease (GERD): Mild symptoms of indigestion, possibly related to recent change in coffee. Patient has started omeprazole. -Continue omeprazole as needed. -Consider dietary modifications, such as trying a different blend of coffee or reducing intake of acidic foods. -Continue monitoring symptoms.  Blood pressure,  no HTN: Well-controlled at home, though elevated in the office. -Continue to monitor.        Return if symptoms worsen or fail to improve.      I, Shirlee Latch, MD, have reviewed all documentation for this visit. The documentation on 11/03/22 for the exam, diagnosis, procedures, and orders are all accurate and complete.   G[place]wood Revoir, Marzella Schlein, MD, MPH [facility]

## 2022-11-03 NOTE — Addendum Note (Signed)
Addended by: Erasmo Downer on: 11/03/2022 03:59 PM   Modules accepted: Level of Service

## 2022-11-27 ENCOUNTER — Other Ambulatory Visit: Payer: Self-pay | Admitting: Family Medicine

## 2022-12-13 ENCOUNTER — Other Ambulatory Visit: Payer: Self-pay | Admitting: Family Medicine

## 2022-12-13 MED ORDER — ESCITALOPRAM OXALATE 10 MG PO TABS: 10.0000 mg | ORAL_TABLET | Freq: Every day | ORAL | 2 refills | Status: DC

## 2022-12-13 NOTE — Telephone Encounter (Signed)
Medication Refill - Medication: escitalopram (LEXAPRO) 10 MG   Has the patient contacted their pharmacy? Yes.    Preferred Pharmacy (with phone number or street name):  [id number] - [place], [place] - [address] AT [facility] [address] [place], [place] [address] Phone: [phone]  Fax: [phone]     Has the patient been seen for an appointment in the last year OR does the patient have an upcoming appointment? Yes.    Agent: Please be advised that RX refills may take up to 3 business days. We ask that you follow-up with your pharmacy.

## 2022-12-13 NOTE — Telephone Encounter (Signed)
Requested Prescriptions  Pending Prescriptions Disp Refills   escitalopram (LEXAPRO) 10 MG tablet 90 tablet 2    Sig: Take 1 tablet (10 mg total) by mouth daily.     Psychiatry:  Antidepressants - SSRI Passed - 12/13/2022 12:41 PM      Passed - Valid encounter within last 6 months    Recent Outpatient Visits           1 month ago Age-related osteoporosis without current pathological fracture   [facility], Marzella Schlein, MD   2 months ago Encounter for annual physical exam   [facility] [place], Marzella Schlein, MD   1 year ago Encounter for annual wellness visit (AWV) in Medicare patient   [facility], Marzella Schlein, MD   2 years ago Encounter for annual physical exam   [facility], Marzella Schlein, MD   3 years ago Encounter for annual physical exam   [facility] [place], Marzella Schlein, MD       Future Appointments             In 9 months Bacigalupo, Marzella Schlein, MD [facility]

## 2022-12-13 NOTE — Telephone Encounter (Signed)
[facility] called and spoke to [place], [facility] about the refill(s) Lexapro requested. Advised it was sent on 11/28/22 #90/2 refill(s). He says they don't have it. Advised I will send it back in. It's showing transmission failed 11/28/22.

## 2022-12-27 ENCOUNTER — Ambulatory Visit: Payer: Medicare PPO | Attending: Family Medicine

## 2022-12-27 DIAGNOSIS — M5459 Other low back pain: Secondary | ICD-10-CM | POA: Diagnosis not present

## 2022-12-27 DIAGNOSIS — M25552 Pain in left hip: Secondary | ICD-10-CM | POA: Diagnosis not present

## 2022-12-27 DIAGNOSIS — M79605 Pain in left leg: Secondary | ICD-10-CM | POA: Insufficient documentation

## 2022-12-27 NOTE — Therapy (Signed)
[facility] [address] [place], [address] Phone: [phone]   Fax:  [phone]  Physical Therapy Evaluation  Patient Details  Name: Robin Stephens MRN: [id number] Date of Birth: September 04, 1950 Referring Provider (PT): Erasmo Downer, MD   Encounter Date: 12/27/2022   PT End of Session - 12/27/22 1113     Visit Number 1    Number of Visits 13    Date for PT Re-Evaluation 02/10/23    PT Start Time 1113    PT Stop Time 1212    PT Time Calculation (min) 59 min    Activity Tolerance Patient tolerated treatment well    Behavior During Therapy [facility] for tasks assessed/performed             Past Medical History:  Diagnosis Date   Age-related osteoporosis without current pathological fracture 11/03/2022   Allergy    Anxiety On occasion   Gastroesophageal reflux disease without esophagitis 11/03/2022    Past Surgical History:  Procedure Laterality Date   APPENDECTOMY  2006   EYE SURGERY     PRK    There were no vitals filed for this visit.    Subjective Assessment - 12/27/22 1117     Subjective L posterior hip: 0/10 currently, 10/10 at worst for the past 3 months.    Pertinent History L posterior hip pain. Botheres her when she leans forward or bends over. MD felt muscle knots L posterior hip upon examination. Pt was recommended dry needling. Has never had dry needling before.  Wants to try dry needling. Massaged her L posterior hip.  Gradual onset since March 2023 which worsened. Denies loss of bowel or bladder control. Has been exercising since she was 72 years old.    Patient Stated Goals Make the pain go away. Be able to lift her 2 year old grand daughter.    Currently in Pain? No/denies    Pain Location Buttocks    Pain Orientation Left    Pain Descriptors / Indicators Tightness;Tender;Sore    Pain Type Chronic pain    Pain Onset More than a month ago    Pain Frequency Occasional    Aggravating  Factors  Leaning forward. Bending over    Pain Relieving Factors Eases off on its own, massage to loosen muscle up. Wenda Overland pas                [facility] PT Assessment - 12/27/22 1107       Assessment   Medical Diagnosis M79.605 (ICD-10-CM) - Leg pain, left    Referring Provider (PT) Beryle Flock, Marzella Schlein, MD    Onset Date/Surgical Date 10/07/22   Date PT referral signed   Prior Therapy Not since she was a child      Precautions   Precaution Comments osteoporosis      Restrictions   Other Position/Activity Restrictions No known restrictions      Balance Screen   Has the patient fallen in the past 6 months No    Has the patient had a decrease in activity level because of a fear of falling?  No    Is the patient reluctant to leave their home because of a fear of falling?  No      Posture/Postural Control   Posture Comments forward neck, B protracted shoulders, R shoulder lower, R lumbar convexity around L4, movement preference around L4, L lateral shift starting around L2      AROM  Lumbar Flexion WFL    Lumbar Extension WFL with reproduction of L posterior hip pain.    Lumbar - Right Side Bend full    Lumbar - Left Side Bend full with L posterior hip pain during the return motion    Lumbar - Right Rotation full    Lumbar - Left Rotation full      PROM   Right Hip Internal Rotation  35    Left Hip Internal Rotation  37   with slight posterior hip symptoms initially.     Strength   Right Hip Flexion 4/5    Right Hip Extension 4-/5    Right Hip ABduction 4/5    Left Hip Flexion 4/5    Left Hip Extension 4-/5    Left Hip ABduction 4/5    Right Knee Flexion 5/5    Right Knee Extension 5/5    Left Knee Flexion 4/5    Left Knee Extension 5/5      Palpation   Palpation comment TTP L piriformis      Special Tests   Other special tests (-) repeated flexion test. L Slump test did not reproduce L posterior hip symptoms.  (+) L piriformis test.      Ambulation/Gait   Gait  Comments Decreased stance L LE with R plelvic drop during L LE stance phase. L posterior hip discomfort when turning around to the left                        Objective measurements completed on examination: See above findings.   Aggravating factors: L hip abduction, circumduction  (-) repeated flexion test. L Slump test did not reproduce L posterior hip symptoms. (+) L piriformis test.     TTP L piriformis No pain with isometric, concentric, and eccentric hip ER in prone     PCP: Bacigalupo, Marzella Schlein, MD   REFERRING PROVIDER: Erasmo Downer, MD   REFERRING DIAG: [id number] (ICD-10-CM) - Leg pain, left  THERAPY DIAG:  Pain in left hip - Plan: PT plan of care cert/re-cert  Other low back pain - Plan: PT plan of care cert/re-cert  Rationale for Evaluation and Treatment: Rehabilitation  ONSET DATE: 10/07/2022 (Date PT referral signed)    Rationale for Evaluation and Treatment Rehabilitation  PERTINENT HISTORY: L posterior hip pain. Botheres her when she leans forward or bends over. MD felt muscle knots L posterior hip upon examination. Pt was recommended dry needling. Has never had dry needling before. Wants to try dry needling. Massaged her L posterior hip. Gradual onset since March 2023 which worsened. Denies loss of bowel or bladder control. Has been exercising since she was 72 years old.   PRECAUTIONS: Osteoporosis.   SUBJECTIVE:   SUBJECTIVE STATEMENT:       PAIN:  Are you having pain? See subjective   TODAY'S TREATMENT:  DATE: 12/27/2022    Modality: (unbilled) Dry needling performed to L piriformis muscle to decrease pain and spasms along patient's L posterior hip region with patient in prone utilizing 2 dry needle(s) .60mm x 30mm with 3 sticks at L piriformis . Patient educated about the risks and  benefits from therapy and verbally consents to treatment.  Dry needling perform  Therapeutic exercises  Supine L piriformis stretch 30 seconds x 3  Pt states doing this exercise at home.  Seated hip IR   L 10x 5 seconds for 2 sets   L posterior hip symptoms. Eases with rest  Seated hip adduction isometrics folded pillow squeeze 10x5 seconds      Improved exercise technique, movement at target joints, use of target muscles after mod verbal, visual, tactile cues.        Response to treatment  Pt tolerated session well without aggravation of symptoms.    Clinical impression Pt is a 72 year old female who came to physical therapy secondary to L posterior hip pain. She also presents with altered gait pattern and posture, TTP L posterior hip along the piriformis muscle, positive special test suggesting L piriformis muscle involvement, reproduction of symptoms with lumbar extension, decreased B hip strength, and difficulty with tasks that involve bending over or leaning forward secondary to pain. Pt will benefit from skilled physical therapy services to address the aforementioned deficits.       PATIENT EDUCATION: Education details: there-ex, HEP Person educated: Patient Education method: Explanation, Demonstration, Tactile cues, and Verbal cues, handout Education comprehension: verbalized understanding and returned demonstration  HOME EXERCISE PROGRAM: Access Code: [email or web address] URL: [email or web address] Date: 12/27/2022 Prepared by: Robin Stephens  Exercises - Seated Hip Adduction Isometrics with Newman Pies  - 1 x daily - 7 x weekly - 3 sets - 10 reps - 5 seconds hold                 PT Short Term Goals - 12/27/22 1232       PT SHORT TERM GOAL #1   Title Pt will be independent with her initial HEP to decrease pain, improve strength and ability to perform tasks which involve leaning forward as well as bending over more comfortably.    Baseline Pt has  started her initial HEP (12/27/2022)    Time 3    Period Weeks    Status New    Target Date 01/20/23               PT Long Term Goals - 12/27/22 1233       PT LONG TERM GOAL #1   Title Pt will have a decrease in L posterior hip pain to 4/10 or less at worst to promote ability to perform tasks which involve leaning forward as well as bending over more comfortably.    Baseline 10/10 L posterior hip pain at worst for the past 3 months (12/27/2022)    Time 8    Period Weeks    Status New    Target Date 02/24/23      PT LONG TERM GOAL #2   Title Pt will improve bilateral hip extension and abduction strength by at least 1/2 MMT grade to promote ability to perform tasks which involve leaning forward as well as bending over more comfortably.    Baseline Hip extension 4-/5 R, and L, hip abduction 4/5 R, and L (12/27/2022)    Time 8    Period Weeks    Status New  Target Date 02/24/23      PT LONG TERM GOAL #3   Title Pt will improve her FOTO score by at least 10 points as a demonstration of improved function.    Baseline Upper leg FOTO: 55 (12/27/2022)    Time 8    Period Weeks    Status New    Target Date 02/24/23                    Plan - 12/27/22 1247     Clinical Impression Statement Pt is a 72 year old female who came to physical therapy secondary to L posterior hip pain. She also presents with altered gait pattern and posture, TTP L posterior hip along the piriformis muscle, positive special test suggesting L piriformis muscle involvement, reproduction of symptoms with lumbar extension, decreased B hip strength, and difficulty with tasks that involve bending over or leaning forward secondary to pain. Pt will benefit from skilled physical therapy services to address the aforementioned deficits.    Personal Factors and Comorbidities Age;Comorbidity 2;Time since onset of injury/illness/exacerbation    Comorbidities Osteoporosis, anxiety    Examination-Activity  Limitations Bend;Locomotion Level    Stability/Clinical Decision Making Stable/Uncomplicated    Clinical Decision Making Low    Rehab Potential Fair    PT Frequency 2x / week    PT Duration 6 weeks    PT Treatment/Interventions Therapeutic activities;Therapeutic exercise;Neuromuscular re-education;Patient/family education;Manual techniques;Dry needling;Aquatic Therapy;Electrical Stimulation;Iontophoresis 4mg /ml Dexamethasone    PT Next Visit Plan posture, trunk and glute strength, manual techniques, modalities PRN    PT Home Exercise Plan medbridge Access Code: [email or web address]    Consulted and Agree with Plan of Care Patient             Patient will benefit from skilled therapeutic intervention in order to improve the following deficits and impairments:  Pain, Improper body mechanics, Postural dysfunction, Decreased strength  Visit Diagnosis: Pain in left hip - Plan: PT plan of care cert/re-cert  Other low back pain - Plan: PT plan of care cert/re-cert     Problem List Patient Active Problem List   Diagnosis Date Noted   Age-related osteoporosis without current pathological fracture 11/03/2022   Gastroesophageal reflux disease without esophagitis 11/03/2022   Family history of heart disease 08/31/2020   Anxiety 01/21/2015   DD (diverticular disease) 01/21/2015   Hypercholesteremia 01/21/2015   Insomnia 01/21/2015   Robin Stephens PT, DPT  12/27/2022, 12:56 PM  [facility] [address], [place], [address] Phone: [phone]   Fax:  [phone]  Name: Robin Stephens MRN: [id number] Date of Birth: [birth year]

## 2022-12-30 ENCOUNTER — Ambulatory Visit: Payer: Medicare PPO

## 2023-01-02 ENCOUNTER — Ambulatory Visit: Payer: Medicare PPO

## 2023-01-04 ENCOUNTER — Ambulatory Visit: Payer: Medicare PPO | Attending: Family Medicine

## 2023-01-04 DIAGNOSIS — M5459 Other low back pain: Secondary | ICD-10-CM | POA: Diagnosis not present

## 2023-01-04 DIAGNOSIS — M25552 Pain in left hip: Secondary | ICD-10-CM | POA: Insufficient documentation

## 2023-01-04 NOTE — Therapy (Signed)
[facility] [address] [place], [address] Phone: [phone]   Fax:  [phone]  Physical Therapy Evaluation  Patient Details  Name: Robin Stephens MRN: [id number] Date of Birth: 01-01-1951 Referring Provider (PT): Erasmo Downer, MD   Encounter Date: 01/04/2023   PT End of Session - 01/04/23 1352     Visit Number 2    Number of Visits 13    Date for PT Re-Evaluation 02/10/23    PT Start Time 1352    PT Stop Time 1426    PT Time Calculation (min) 34 min    Activity Tolerance Patient tolerated treatment well    Behavior During Therapy [facility] for tasks assessed/performed              Past Medical History:  Diagnosis Date   Age-related osteoporosis without current pathological fracture 11/03/2022   Allergy    Anxiety On occasion   Gastroesophageal reflux disease without esophagitis 11/03/2022    Past Surgical History:  Procedure Laterality Date   APPENDECTOMY  2006   EYE SURGERY     PRK    There were no vitals filed for this visit.                      Objective measurements completed on examination: See above findings.   Aggravating factors: L hip abduction, circumduction  (-) repeated flexion test. L Slump test did not reproduce L posterior hip symptoms. (+) L piriformis test.     TTP L piriformis No pain with isometric, concentric, and eccentric hip ER in prone     PCP: Bacigalupo, Marzella Schlein, MD   REFERRING PROVIDER: Erasmo Downer, MD   REFERRING DIAG: [phone] (ICD-10-CM) - Leg pain, left  THERAPY DIAG:  Pain in left hip  Other low back pain  Rationale for Evaluation and Treatment: Rehabilitation  ONSET DATE: 10/07/2022 (Date PT referral signed)    Rationale for Evaluation and Treatment Rehabilitation  PERTINENT HISTORY: L posterior hip pain. Botheres her when she leans forward or bends over. MD felt muscle knots L posterior hip upon examination.  Pt was recommended dry needling. Has never had dry needling before. Wants to try dry needling. Massaged her L posterior hip. Gradual onset since March 2023 which worsened. Denies loss of bowel or bladder control. Has been exercising since she was 72 years old.   PRECAUTIONS: Osteoporosis.   SUBJECTIVE:   SUBJECTIVE STATEMENT: L hip symptoms felt like is spread out a little after session, then it got smaller again. The dry needling made the area sore. 3/10 L posterior hip currently. Bending over makes it increase.       PAIN:  Are you having pain? See subjective   TODAY'S TREATMENT:  DATE: 01/04/2023      Therapeutic exercises  Standing lumbar extension 5x5 seconds for 2 sets, then 10x5 seconds  Standing L lateral shift correction 10x5 seconds for 2 sets   Prone on elbows 2 minutes   Decreased L posterior hip pain to 2/10 in sitting afterwards  Prone press-ups 10x5 seconds for 3 sets  No L posterior hip pain in sitting afterwards  Pt education of lumbar disc and L posterior hip symptoms.      Improved exercise technique, movement at target joints, use of target muscles after mod verbal, visual, tactile cues.        Response to treatment  Pt tolerated session well without aggravation of symptoms.    Clinical impression Signs and symptoms suggests lumbar disc involvement. Decreased L posterior hip pain with lumbar extension based treatment. No L posterior hip pain reported after session. Pt will benefit from continued skilled physical therapy services to decrease pain, improve strength and function.       PATIENT EDUCATION: Education details: there-ex, HEP Person educated: Patient Education method: Explanation, Demonstration, Tactile cues, and Verbal cues, handout Education comprehension: verbalized understanding and returned  demonstration  HOME EXERCISE PROGRAM: Access Code: [email or web address] URL: [email or web address] Date: 01/04/2023 Prepared by: Loralyn Freshwater  Exercises - Seated Hip Adduction Isometrics with Ball  - 1 x daily - 7 x weekly - 3 sets - 10 reps - 5 seconds hold - Prone Press Up  - 1 x daily - 7 x weekly - 3 sets - 10 reps - 5 seconds hold - Static Prone on Elbows  - 2 x daily - 7 x weekly - 1 sets - 2 reps - 2 minutes hold                PT Short Term Goals - 12/27/22 1232       PT SHORT TERM GOAL #1   Title Pt will be independent with her initial HEP to decrease pain, improve strength and ability to perform tasks which involve leaning forward as well as bending over more comfortably.    Baseline Pt has started her initial HEP (12/27/2022)    Time 3    Period Weeks    Status New    Target Date 01/20/23               PT Long Term Goals - 12/27/22 1233       PT LONG TERM GOAL #1   Title Pt will have a decrease in L posterior hip pain to 4/10 or less at worst to promote ability to perform tasks which involve leaning forward as well as bending over more comfortably.    Baseline 10/10 L posterior hip pain at worst for the past 3 months (12/27/2022)    Time 8    Period Weeks    Status New    Target Date 02/24/23      PT LONG TERM GOAL #2   Title Pt will improve bilateral hip extension and abduction strength by at least 1/2 MMT grade to promote ability to perform tasks which involve leaning forward as well as bending over more comfortably.    Baseline Hip extension 4-/5 R, and L, hip abduction 4/5 R, and L (12/27/2022)    Time 8    Period Weeks    Status New    Target Date 02/24/23      PT LONG TERM GOAL #3   Title Pt will improve her FOTO score by at least 10 points as  a demonstration of improved function.    Baseline Upper leg FOTO: 55 (12/27/2022)    Time 8    Period Weeks    Status New    Target Date 02/24/23                    Plan -  01/04/23 1351     Clinical Impression Statement Signs and symptoms suggests lumbar disc involvement. Decreased L posterior hip pain with lumbar extension based treatment. No L posterior hip pain reported after session. Pt will benefit from continued skilled physical therapy services to decrease pain, improve strength and function.    Personal Factors and Comorbidities Age;Comorbidity 2;Time since onset of injury/illness/exacerbation    Comorbidities Osteoporosis, anxiety    Examination-Activity Limitations Bend;Locomotion Level    Stability/Clinical Decision Making Stable/Uncomplicated    Rehab Potential Fair    PT Frequency 2x / week    PT Duration 6 weeks    PT Treatment/Interventions Therapeutic activities;Therapeutic exercise;Neuromuscular re-education;Patient/family education;Manual techniques;Dry needling;Aquatic Therapy;Electrical Stimulation;Iontophoresis 4mg /ml Dexamethasone    PT Next Visit Plan posture, trunk and glute strength, manual techniques, modalities PRN    PT Home Exercise Plan medbridge Access Code: [email or web address]    Consulted and Agree with Plan of Care Patient             Patient will benefit from skilled therapeutic intervention in order to improve the following deficits and impairments:  Pain, Improper body mechanics, Postural dysfunction, Decreased strength  Visit Diagnosis: Pain in left hip  Other low back pain     Problem List Patient Active Problem List   Diagnosis Date Noted   Age-related osteoporosis without current pathological fracture 11/03/2022   Gastroesophageal reflux disease without esophagitis 11/03/2022   Family history of heart disease 08/31/2020   Anxiety 01/21/2015   DD (diverticular disease) 01/21/2015   Hypercholesteremia 01/21/2015   Insomnia 01/21/2015   Loralyn Freshwater PT, DPT  01/04/2023, 4:55 PM  [facility] [address], [place], [address] Phone: [phone]   Fax:   [phone]  Name: Robin Stephens MRN: [id number] Date of Birth: Dec 16, 1950

## 2023-01-31 ENCOUNTER — Encounter: Payer: Self-pay | Admitting: Family Medicine

## 2023-02-02 ENCOUNTER — Other Ambulatory Visit: Payer: Self-pay

## 2023-03-09 ENCOUNTER — Other Ambulatory Visit: Payer: Self-pay | Admitting: Family Medicine

## 2023-03-09 DIAGNOSIS — E7800 Pure hypercholesterolemia, unspecified: Secondary | ICD-10-CM

## 2023-07-20 ENCOUNTER — Telehealth: Payer: Medicare PPO | Admitting: Family Medicine

## 2023-07-20 ENCOUNTER — Encounter: Payer: Self-pay | Admitting: Family Medicine

## 2023-07-20 DIAGNOSIS — K219 Gastro-esophageal reflux disease without esophagitis: Secondary | ICD-10-CM | POA: Diagnosis not present

## 2023-07-20 DIAGNOSIS — R1013 Epigastric pain: Secondary | ICD-10-CM | POA: Diagnosis not present

## 2023-07-20 MED ORDER — OMEPRAZOLE 20 MG PO CPDR: 20.0000 mg | DELAYED_RELEASE_CAPSULE | Freq: Every day | ORAL | 1 refills | Status: AC

## 2023-07-20 NOTE — Progress Notes (Signed)
 MyChart Video Visit    Virtual Visit via Video Note   This format is felt to be most appropriate for this patient at this time. Physical exam was limited by quality of the video and audio technology used for the visit.    Patient location: home Provider location: home office  Persons involved in the visit: patient, provider  I discussed the limitations of evaluation and management by telemedicine and the availability of in person appointments. The patient expressed understanding and agreed to proceed.  Patient: Robin Stephens   DOB: [birth year]   73 y.o. Female  MRN: [id number] Visit Date: 07/20/2023  Today's healthcare provider: Shirlee Latch, MD   No chief complaint on file.  Subjective    HPI   Discussed the use of AI scribe software for clinical note transcription with the patient, who gave verbal consent to proceed.  History of Present Illness   The patient, with a history of appendectomy, presents with a several-month history of discomfort located under the left rib cage (toward midline, epigastric), sometimes radiating to the back. The discomfort is described as a nagging pain, more noticeable in the evening, often disrupting sleep. The patient has attempted to manage the discomfort with six-hour Tylenol at night and has tried heat application without significant relief. The patient has also experimented with dietary changes, such as smaller evening meals and monitoring the effects of coffee, but has not identified any specific triggers or alleviating factors. The patient has tried over-the-counter reflux medication (omeprazole) without noticeable improvement (but only for a few doses). The patient denies any associated changes in bowel habits, nausea, vomiting, blood in stool, fevers, or severe pain. The patient also denies any respiratory symptoms.         Review of Systems      Objective    There were no vitals taken for this visit.      Physical  Exam Constitutional:      General: She is not in acute distress.    Appearance: Normal appearance.  HENT:     Head: Normocephalic.  Pulmonary:     Effort: Pulmonary effort is normal. No respiratory distress.  Neurological:     Mental Status: She is alert and oriented to person, place, and time. Mental status is at baseline.        Assessment & Plan     Problem List Items Addressed This Visit       Digestive   Gastroesophageal reflux disease without esophagitis   Relevant Medications   omeprazole (PRILOSEC) 20 MG capsule   Other Visit Diagnoses       Epigastric abdominal pain    -  Primary   Relevant Orders   Comprehensive metabolic panel   Lipase   CBC w/Diff/Platelet       Assessment and Plan    Epigastric Pain Intermittent epigastric pain under the left rib cage, radiating to the back, present for months, worse in the evening. No changes in bowel habits, nausea, vomiting, blood in stool, or fever. Differential diagnosis includes gastritis, GERD, and less likely pancreatitis or gallbladder disease. Previous short-term omeprazole use without significant relief suggests a longer course may be necessary. Discussed need for liver function tests, pancreatic enzyme levels, and CBC to rule out other causes. Advised on dietary and medication modifications to prevent symptom exacerbation. - Order liver function tests, pancreatic enzyme levels, and CBC - Prescribe omeprazole 20 mg daily for one month - Advise to take omeprazole in the morning before  meals - Recommend Tylenol or Tums/Rolaids for additional symptom relief at bedtime - Advise to avoid acidic foods, spicy foods, and NSAIDs - Send prescription to [facility] at [facility] - Follow up if symptoms do not improve or worsen  General Health Maintenance Discussed the importance of avoiding certain foods and medications that may exacerbate gastritis or GERD symptoms. - Advise to avoid coffee, tomato-based foods, citrus,  and spicy foods - Recommend avoiding NSAIDs and using Tylenol instead  Follow-up - Follow up with lab results when available - Continue omeprazole for one month and reassess symptoms - Contact if symptoms do not improve or if there are any concerns.         Meds ordered this encounter  Medications   omeprazole (PRILOSEC) 20 MG capsule    Sig: Take 1 capsule (20 mg total) by mouth daily.    Dispense:  30 capsule    Refill:  1     Return if symptoms worsen or fail to improve.     I discussed the assessment and treatment plan with the patient. The patient was provided an opportunity to ask questions and all were answered. The patient agreed with the plan and demonstrated an understanding of the instructions.   The patient was advised to call back or seek an in-person evaluation if the symptoms worsen or if the condition fails to improve as anticipated.   Shirlee Latch, MD [facility] [phone] (phone) [phone] (fax)  [facility]

## 2023-07-25 DIAGNOSIS — R1013 Epigastric pain: Secondary | ICD-10-CM | POA: Diagnosis not present

## 2023-07-26 LAB — COMPREHENSIVE METABOLIC PANEL
ALT: 18 IU/L (ref 0–32)
AST: 20 IU/L (ref 0–40)
Albumin: 4.4 g/dL (ref 3.8–4.8)
Alkaline Phosphatase: 84 IU/L (ref 44–121)
BUN/Creatinine Ratio: 30 — ABNORMAL HIGH (ref 12–28)
BUN: 22 mg/dL (ref 8–27)
Bilirubin Total: 0.2 mg/dL (ref 0.0–1.2)
CO2: 23 mmol/L (ref 20–29)
Calcium: 9 mg/dL (ref 8.7–10.3)
Chloride: 104 mmol/L (ref 96–106)
Creatinine, Ser: 0.73 mg/dL (ref 0.57–1.00)
Globulin, Total: 2.3 g/dL (ref 1.5–4.5)
Glucose: 114 mg/dL — ABNORMAL HIGH (ref 70–99)
Potassium: 4 mmol/L (ref 3.5–5.2)
Sodium: 142 mmol/L (ref 134–144)
Total Protein: 6.7 g/dL (ref 6.0–8.5)
eGFR: 88 mL/min/{1.73_m2} (ref >59)

## 2023-07-26 LAB — CBC WITH DIFFERENTIAL/PLATELET
Basophils Absolute: 0 10*3/uL (ref 0.0–0.2)
Basos: 0 %
EOS (ABSOLUTE): 0.1 10*3/uL (ref 0.0–0.4)
Eos: 2 %
Hematocrit: 42 % (ref 34.0–46.6)
Hemoglobin: 13.9 g/dL (ref 11.1–15.9)
Immature Grans (Abs): 0 10*3/uL (ref 0.0–0.1)
Immature Granulocytes: 0 %
Lymphocytes Absolute: 2.4 10*3/uL (ref 0.7–3.1)
Lymphs: 29 %
MCH: 31.4 pg (ref 26.6–33.0)
MCHC: 33.1 g/dL (ref 31.5–35.7)
MCV: 95 fL (ref 79–97)
Monocytes Absolute: 0.7 10*3/uL (ref 0.1–0.9)
Monocytes: 8 %
Neutrophils Absolute: 4.8 10*3/uL (ref 1.4–7.0)
Neutrophils: 61 %
Platelets: 225 10*3/uL (ref 150–450)
RBC: 4.42 x10E6/uL (ref 3.77–5.28)
RDW: 11.8 % (ref 11.7–15.4)
WBC: 8 10*3/uL (ref 3.4–10.8)

## 2023-07-26 LAB — LIPASE: Lipase: 19 U/L (ref 14–85)

## 2023-07-27 ENCOUNTER — Encounter: Payer: Self-pay | Admitting: Family Medicine

## 2023-08-31 ENCOUNTER — Telehealth: Payer: Self-pay | Admitting: Family Medicine

## 2023-08-31 ENCOUNTER — Other Ambulatory Visit: Payer: Self-pay | Admitting: Family Medicine

## 2023-08-31 DIAGNOSIS — E7800 Pure hypercholesterolemia, unspecified: Secondary | ICD-10-CM

## 2023-08-31 MED ORDER — ESCITALOPRAM OXALATE 10 MG PO TABS: 10.0000 mg | ORAL_TABLET | Freq: Every day | ORAL | 0 refills | Status: DC

## 2023-08-31 NOTE — Telephone Encounter (Signed)
[facility] faxed refill request for the following medications:  escitalopram (LEXAPRO) 10 MG tablet   Please advise.  

## 2023-09-01 NOTE — Telephone Encounter (Signed)
 Requested Prescriptions  Pending Prescriptions Disp Refills   rosuvastatin (CRESTOR) 10 MG tablet [Pharmacy Med Name: ROSUVASTATIN 10MG  TABLETS] 90 tablet 0    Sig: TAKE 1 TABLET(10 MG) BY MOUTH DAILY     Cardiovascular:  Antilipid - Statins 2 Failed - 09/01/2023 10:46 AM      Failed - Lipid Panel in normal range within the last 12 months    Cholesterol, Total  Date Value Ref Range Status  09/23/2022 187 100 - 199 mg/dL Final   LDL Chol Calc (NIH)  Date Value Ref Range Status  09/23/2022 74 0 - 99 mg/dL Final   HDL  Date Value Ref Range Status  09/23/2022 101 >39 mg/dL Final   Triglycerides  Date Value Ref Range Status  09/23/2022 62 0 - 149 mg/dL Final         Passed - Cr in normal range and within 360 days    Creatinine, Ser  Date Value Ref Range Status  07/25/2023 0.73 0.57 - 1.00 mg/dL Final         Passed - Patient is not pregnant      Passed - Valid encounter within last 12 months    Recent Outpatient Visits           1 month ago Epigastric abdominal pain   [facility] [place], Marzella Schlein, MD       Future Appointments             In 3 weeks Bacigalupo, Marzella Schlein, MD [facility]

## 2023-09-20 ENCOUNTER — Ambulatory Visit: Payer: Self-pay

## 2023-09-20 DIAGNOSIS — Z0000 Encounter for general adult medical examination without abnormal findings: Secondary | ICD-10-CM | POA: Diagnosis not present

## 2023-09-20 DIAGNOSIS — Z1231 Encounter for screening mammogram for malignant neoplasm of breast: Secondary | ICD-10-CM

## 2023-09-20 NOTE — Progress Notes (Signed)
 Subjective:   Robin Stephens is a 73 y.o. who presents for a Medicare Wellness preventive visit.  Visit Complete: Virtual I connected with  Robin Stephens on 09/20/23 by a audio enabled telemedicine application and verified that I am speaking with the correct person using two identifiers.  Patient Location: Home  Provider Location: Home Office  I discussed the limitations of evaluation and management by telemedicine. The patient expressed understanding and agreed to proceed.  Vital Signs: Because this visit was a virtual/telehealth visit, some criteria may be missing or patient reported. Any vitals not documented were not able to be obtained and vitals that have been documented are patient reported.  VideoDeclined- This patient declined Librarian, academic. Therefore the visit was completed with audio only.  Persons Participating in Visit: Patient.  AWV Questionnaire: No: Patient Medicare AWV questionnaire was not completed prior to this visit.  Cardiac Risk Factors include: advanced age (>39men, >65 women);dyslipidemia     Objective:    There were no vitals filed for this visit. There is no height or weight on file to calculate BMI.     09/20/2023    2:42 PM 12/27/2022   11:15 AM 09/19/2022    1:41 PM 08/31/2020    8:33 AM 08/20/2019   10:11 AM 07/11/2018    9:01 AM 07/11/2018    8:59 AM  Advanced Directives  Does Patient Have a Medical Advance Directive? No Yes Yes No No No Yes  Type of Advance Directive  Living will     Healthcare Power of [place];Living will  Does patient want to make changes to medical advance directive?  No - Patient declined    Yes (MAU/Ambulatory/Procedural Areas - Information given)   Copy of Healthcare Power of Attorney in Chart?       No - copy requested  Would patient like information on creating a medical advance directive? No - Patient declined   No - Patient declined No - Patient declined      Current Medications  (verified) Outpatient Encounter Medications as of 09/20/2023  Medication Sig   Ginger, Zingiber officinalis, 550 MG CAPS Take by mouth daily.    MULTIPLE VITAMINS PO Take by mouth daily.    Omega-3 Fatty Acids (FISH OIL) 1200 MG CAPS Take by mouth daily.    omeprazole  (PRILOSEC) 20 MG capsule Take 1 capsule (20 mg total) by mouth daily.   TURMERIC PO Take by mouth daily. Unsure dose   [DISCONTINUED] calcium  carbonate (OS-CAL) 600 MG TABS tablet Take 1,200 mg by mouth daily with breakfast.    [DISCONTINUED] Collagenase POWD by Does not apply route 2 (two) times daily. Unsure dose   [DISCONTINUED] escitalopram  (LEXAPRO ) 10 MG tablet Take 1 tablet (10 mg total) by mouth daily.   [DISCONTINUED] Glucosamine-Chondroitin (GLUCOSAMINE CHONDR COMPLEX PO) Take by mouth daily. Unsure dose   [DISCONTINUED] Lactobacillus (ACIDOPHILUS) 100 MG CAPS Take by mouth daily.    [DISCONTINUED] rosuvastatin  (CRESTOR ) 10 MG tablet TAKE 1 TABLET(10 MG) BY MOUTH DAILY   No facility-administered encounter medications on file as of 09/20/2023.    Allergies (verified) Sulfa antibiotics   History: Past Medical History:  Diagnosis Date   Age-related osteoporosis without current pathological fracture 11/03/2022   Allergy    Anxiety On occasion   Gastroesophageal reflux disease without esophagitis 11/03/2022   Past Surgical History:  Procedure Laterality Date   APPENDECTOMY  2006   EYE SURGERY     PRK   Family History  Problem Relation  Age of Onset   Heart disease Father    Obesity Sister    Cancer Sister    Breast cancer Sister 26       radiation; in remission   Breast cancer Mother 81   Cancer Mother    Healthy Sister    Social History   Socioeconomic History   Marital status: Married    Spouse name: Willard Harman   Number of children: 2   Years of education: 15   Highest education level: Associate degree: academic program  Occupational History   Occupation: retired  Tobacco Use   Smoking status: Never    Smokeless tobacco: Never  Vaping Use   Vaping status: Never Used  Substance and Sexual Activity   Alcohol use: Yes    Alcohol/week: 3.0 standard drinks of alcohol    Types: 3 Glasses of wine per week   Drug use: No   Sexual activity: Yes  Other Topics Concern   Not on file  Social History Narrative   Not on file   Social Drivers of Health   Financial Resource Strain: Low Risk  (09/20/2023)   Overall Financial Resource Strain (CARDIA)    Difficulty of Paying Living Expenses: Not very hard  Food Insecurity: No Food Insecurity (09/20/2023)   Hunger Vital Sign    Worried About Running Out of Food in the Last Year: Never true    Ran Out of Food in the Last Year: Never true  Transportation Needs: No Transportation Needs (09/20/2023)   PRAPARE - Administrator, Civil Service (Medical): No    Lack of Transportation (Non-Medical): No  Physical Activity: Sufficiently Active (09/20/2023)   Exercise Vital Sign    Days of Exercise per Week: 6 days    Minutes of Exercise per Session: 40 min  Stress: No Stress Concern Present (09/20/2023)   [employer] of Occupational Health - Occupational Stress Questionnaire    Feeling of Stress : Not at all  Social Connections: Socially Integrated (09/20/2023)   Social Connection and Isolation Panel [NHANES]    Frequency of Communication with Friends and Family: More than three times a week    Frequency of Social Gatherings with Friends and Family: Never    Attends Religious Services: More than 4 times per year    Active Member of [employer] or Organizations: Yes    Attends Engineer, structural: More than 4 times per year    Marital Status: Married    Tobacco Counseling Counseling given: Not Answered    Clinical Intake:  Pre-visit preparation completed: Yes  Pain : No/denies pain     BMI - recorded: 21 Nutritional Status: BMI of 19-24  Normal Nutritional Risks: None Diabetes: No  Lab Results  Component Value Date    HGBA1C 5.2 07/21/2017   HGBA1C 5.2 06/29/2016     How often do you need to have someone help you when you read instructions, pamphlets, or other written materials from your doctor or pharmacy?: 1 - Never  Interpreter Needed?: No  Information entered by :: Dellie Fergusson, LPN   Activities of Daily Living    09/20/2023    2:43 PM 09/19/2023    5:43 PM  In your present state of health, do you have any difficulty performing the following activities:  Hearing? 0 0  Vision? 0 0  Difficulty concentrating or making decisions? 0 0  Walking or climbing stairs? 0 0  Dressing or bathing? 0 0  Doing errands, shopping? 0 0  Preparing  Food and eating ? N N  Using the Toilet? N N  In the past six months, have you accidently leaked urine? N N  Do you have problems with loss of bowel control? N N  Managing your Medications? N N  Managing your Finances? N N  Housekeeping or managing your Housekeeping? N N    Patient Care Team: Mazie Speed, MD as PCP - General (Family Medicine) [place], [facility]  Indicate any recent Medical Services you may have received from other than [facility] providers in the past year (date may be approximate).     Assessment:   This is a routine wellness examination for Robin Stephens.  Hearing/Vision screen Hearing Screening - Comments:: NO AIDS Vision Screening - Comments:: WEARS CONTACTS/GLASSES- DR.PATTY   Goals Addressed             This Visit's Progress    DIET - EAT MORE FRUITS AND VEGETABLES         Depression Screen     09/20/2023    2:39 PM 09/19/2022    1:38 PM 09/13/2021    8:46 AM 08/31/2020    8:29 AM 08/20/2019   10:05 AM 07/11/2018    9:00 AM 07/10/2017   10:27 AM  PHQ 2/9 Scores  PHQ - 2 Score 0 0 0 0 0 0 0  PHQ- 9 Score 0  0        Fall Risk     09/20/2023    2:43 PM 09/19/2023    5:43 PM 09/15/2022    3:02 PM 09/13/2021    8:46 AM 09/12/2021    7:36 PM  Fall Risk   Falls in the past year? 0 0 0 0 0  Number falls in past  yr: 0 0 0 0   Injury with Fall? 0 0 0 0   Risk for fall due to : No Fall Risks   No Fall Risks   Follow up Falls prevention discussed;Falls evaluation completed   Falls evaluation completed     MEDICARE RISK AT HOME:  Medicare Risk at Home Any stairs in or around the home?: Yes If so, are there any without handrails?: No Home free of loose throw rugs in walkways, pet beds, electrical cords, etc?: Yes Adequate lighting in your home to reduce risk of falls?: Yes Life alert?: No Use of a cane, walker or w/c?: No Grab bars in the bathroom?: No Shower chair or bench in shower?: No Elevated toilet seat or a handicapped toilet?: No  TIMED UP AND GO:  Was the test performed?  No  Cognitive Function: 6CIT completed        09/20/2023    2:44 PM 09/19/2022    1:42 PM  6CIT Screen  What Year? 0 points 0 points  What month? 0 points 0 points  What time?  0 points  Count back from 20  0 points  Months in reverse  0 points  Repeat phrase  0 points  Total Score  0 points    Immunizations Immunization History  Administered Date(s) Administered   Fluad Quad(high Dose 65+) 03/12/2019   Influenza, High Dose Seasonal PF 03/06/2018   Influenza,inj,Quad PF,6+ Mos 02/27/2017   Moderna Sars-Covid-2 Vaccination 07/12/2019, 08/09/2019, 03/21/2020   Pneumococcal Conjugate-13 07/10/2017   Pneumococcal Polysaccharide-23 07/11/2018   Td 10/17/2003   Tdap 01/01/2013   Zoster Recombinant(Shingrix) 12/05/2017, 02/06/2018    Screening Tests Health Maintenance  Topic Date Due   DTaP/Tdap/Td (3 - Td or Tdap)  01/02/2023   COVID-19 Vaccine (4 - 2024-25 season) 01/29/2023   MAMMOGRAM  10/26/2023   INFLUENZA VACCINE  12/29/2023   Medicare Annual Wellness (AWV)  09/19/2024   Colonoscopy  09/24/2024   DEXA SCAN  10/25/2024   Pneumonia Vaccine 73+ Years old  Completed   Hepatitis C Screening  Completed   Zoster Vaccines- Shingrix  Completed   HPV VACCINES  Aged Out   Meningococcal B Vaccine   Aged Out    Health Maintenance  Health Maintenance Due  Topic Date Due   DTaP/Tdap/Td (3 - Td or Tdap) 01/02/2023   COVID-19 Vaccine (4 - 2024-25 season) 01/29/2023   Health Maintenance Items Addressed: BDS & COLONOSCOPY UP TO DATE, MAMMOGRAM ORDERED, SHOTS UP TO DATE EXCEPT TDAP, WANTS NO MORE COVIDS  Additional Screening:  Vision Screening: Recommended annual ophthalmology exams for early detection of glaucoma and other disorders of the eye.  Dental Screening: Recommended annual dental exams for proper oral hygiene  Community Resource Referral / Chronic Care Management: CRR required this visit?  No   CCM required this visit?  No     Plan:     I have personally reviewed and noted the following in the patient's chart:   Medical and social history Use of alcohol, tobacco or illicit drugs  Current medications and supplements including opioid prescriptions. Patient is not currently taking opioid prescriptions. Functional ability and status Nutritional status Physical activity Advanced directives List of other physicians Hospitalizations, surgeries, and ER visits in previous 12 months Vitals Screenings to include cognitive, depression, and falls Referrals and appointments  In addition, I have reviewed and discussed with patient certain preventive protocols, quality metrics, and best practice recommendations. A written personalized care plan for preventive services as well as general preventive health recommendations were provided to patient.     Pinky Bright, LPN   1/61/0960   After Visit Summary: (MyChart) Due to this being a telephonic visit, the after visit summary with patients personalized plan was offered to patient via MyChart   Notes:  MAMMOGRAM ORDERED

## 2023-09-20 NOTE — Patient Instructions (Addendum)
 Robin Stephens , Thank you for taking time to come for your Medicare Wellness Visit. I appreciate your ongoing commitment to your health goals. Please review the following plan we discussed and let me know if I can assist you in the future.   Referrals/Orders/Follow-Ups/Clinician Recommendations: MAMMOGRAM ORDERED  You have an order for:  []   2D Mammogram  [x]   3D Mammogram  []   Bone Density     Please call for appointment:  [facility]  [address] [place] [address] [phone] [facility] [address] [place], [place] [address] [phone] [facility] [address] Tracey [address] [place], [place] [address] [phone]   Make sure to wear two-piece clothing.  No lotions, powders, or deodorants the day of the appointment. Make sure to bring picture ID and insurance card.  Bring list of medications you are currently taking including any supplements.   Schedule your [place] screening mammogram through MyChart!   Log into your MyChart account.  Go to 'Visit' (or 'Appointments' if on mobile App) --> Schedule an Appointment  Under 'Select a Reason for Visit' choose the Mammogram Screening option.  Complete the pre-visit questions and select the time and place that best fits your schedule.   This is a list of the screening recommended for you and due dates:  Health Maintenance  Topic Date Due   DTaP/Tdap/Td vaccine (3 - Td or Tdap) 01/02/2023   COVID-19 Vaccine (4 - 2024-25 season) 01/29/2023   Mammogram  10/26/2023   Flu Shot  12/29/2023   Medicare Annual Wellness Visit  09/19/2024   Colon Cancer Screening  09/24/2024   DEXA scan (bone density measurement)  10/25/2024   Pneumonia Vaccine  Completed   Hepatitis C Screening  Completed   Zoster (Shingles) Vaccine  Completed   HPV Vaccine  Aged Out   Meningitis B Vaccine  Aged Out    Advanced  directives: (ACP Link)Information on Advanced Care Planning can be found at [place]  Best boy Advance Health Care Directives Advance Health Care Directives. [email or web address]   Next Medicare Annual Wellness Visit scheduled for next year: Yes   09/25/24 @ 1:50 PM BY PHONE

## 2023-09-25 ENCOUNTER — Encounter: Payer: Self-pay | Admitting: Family Medicine

## 2023-09-25 ENCOUNTER — Ambulatory Visit (INDEPENDENT_AMBULATORY_CARE_PROVIDER_SITE_OTHER): Payer: Self-pay | Admitting: Family Medicine

## 2023-09-25 VITALS — BP 130/74 | HR 65 | Ht 61.0 in | Wt 113.3 lb

## 2023-09-25 DIAGNOSIS — Z0001 Encounter for general adult medical examination with abnormal findings: Secondary | ICD-10-CM

## 2023-09-25 DIAGNOSIS — T466X5A Adverse effect of antihyperlipidemic and antiarteriosclerotic drugs, initial encounter: Secondary | ICD-10-CM

## 2023-09-25 DIAGNOSIS — E7800 Pure hypercholesterolemia, unspecified: Secondary | ICD-10-CM

## 2023-09-25 DIAGNOSIS — Z1231 Encounter for screening mammogram for malignant neoplasm of breast: Secondary | ICD-10-CM

## 2023-09-25 DIAGNOSIS — M810 Age-related osteoporosis without current pathological fracture: Secondary | ICD-10-CM | POA: Diagnosis not present

## 2023-09-25 DIAGNOSIS — F5104 Psychophysiologic insomnia: Secondary | ICD-10-CM

## 2023-09-25 DIAGNOSIS — F419 Anxiety disorder, unspecified: Secondary | ICD-10-CM | POA: Diagnosis not present

## 2023-09-25 DIAGNOSIS — M791 Myalgia, unspecified site: Secondary | ICD-10-CM | POA: Insufficient documentation

## 2023-09-25 DIAGNOSIS — R109 Unspecified abdominal pain: Secondary | ICD-10-CM

## 2023-09-25 DIAGNOSIS — Z0000 Encounter for general adult medical examination without abnormal findings: Secondary | ICD-10-CM

## 2023-09-25 DIAGNOSIS — R1012 Left upper quadrant pain: Secondary | ICD-10-CM

## 2023-09-25 DIAGNOSIS — R931 Abnormal findings on diagnostic imaging of heart and coronary circulation: Secondary | ICD-10-CM | POA: Insufficient documentation

## 2023-09-25 MED ORDER — EZETIMIBE 10 MG PO TABS
10.0000 mg | ORAL_TABLET | Freq: Every day | ORAL | 3 refills | Status: DC
Start: 2023-09-25 — End: 2024-04-15

## 2023-09-25 NOTE — Assessment & Plan Note (Signed)
 Discontinued escitalopram . Using Beam Dream supplement for sleep, which has been effective. Ingredients include magnesium, melatonin, and L-theanine.

## 2023-09-25 NOTE — Assessment & Plan Note (Signed)
 No longer on lexapro  Well controlled

## 2023-09-25 NOTE — Assessment & Plan Note (Signed)
 Discontinued Fosamax . Using Ocean Essence supplement for bone health. Discussed importance of calcium , vitamin D, and weight-bearing exercise for bone density. Next bone density scan due in 2026.

## 2023-09-25 NOTE — Assessment & Plan Note (Signed)
 Discontinued rosuvastatin  due to muscle aches. Zetia prescribed as an alternative with fewer side effects. Discussed benefits of statins in reducing heart attack, stroke, and mortality compared to supplements. Consider CoQ10 for muscle aches if needed. Explained that supplements like red yeast rice lower cholesterol but do not reduce heart attack, stroke, and mortality rates as statins do. - Prescribe Zetia - Order labs for cholesterol, kidney, and liver function - Discuss CoQ10 supplementation if muscle aches occur

## 2023-09-25 NOTE — Progress Notes (Signed)
 Complete physical exam   Patient: Robin Stephens   DOB: 1950-08-29   73 y.o. Female  MRN: [id number] Visit Date: 09/25/2023  Today's healthcare provider: Aden Agreste, MD   Chief Complaint  Patient presents with   Annual Exam    Last completed 09/23/22 Diet -  General, healthy Exercise - 5 to 6 days a week using eliptical machine as well and strength training and exercises for at least a hour Feeling - well with some GI upset  Sleeping - well  Concerns - stopped lexapro  about a month ago as well as cholesterol medication   Subjective    Robin Stephens is a 73 y.o. female who presents today for a complete physical exam.   Discussed the use of AI scribe software for clinical note transcription with the patient, who gave verbal consent to proceed.  History of Present Illness   The patient, with a history of sleep issues, digestive problems, and muscle aches, presents for a routine check-up. She reports discontinuing Lexapro  due to sleep disturbances and has started using a sleep supplement called Beam Dream, which she reports has improved her sleep quality and daytime energy levels. She also mentions having stopped a cholesterol medication due to muscle aches and is open to trying a new medication, Zetia, as suggested by the doctor.  The patient also reports a persistent discomfort in the left flank/rib area, which has been present for several months. The discomfort is not associated with eating or specific movements and has remained constant over time. The patient denies any rash in the area.  In addition, the patient mentions having stopped Fosamax , a medication for bone health, and has started a supplement called [employer].           No data to display           Last depression screening scores    09/20/2023    2:39 PM 09/19/2022    1:38 PM 09/13/2021    8:46 AM  PHQ 2/9 Scores  PHQ - 2 Score 0 0 0  PHQ- 9 Score 0  0   Last fall risk screening     09/20/2023    2:43 PM  Fall Risk   Falls in the past year? 0  Number falls in past yr: 0  Injury with Fall? 0  Risk for fall due to : No Fall Risks  Follow up Falls prevention discussed;Falls evaluation completed        Medications: Outpatient Medications Prior to Visit  Medication Sig   Ginger, Zingiber officinalis, 550 MG CAPS Take by mouth daily.    MULTIPLE VITAMINS PO Take by mouth daily.    Omega-3 Fatty Acids (FISH OIL) 1200 MG CAPS Take by mouth daily.    omeprazole  (PRILOSEC) 20 MG capsule Take 1 capsule (20 mg total) by mouth daily.   TURMERIC PO Take by mouth daily. Unsure dose   No facility-administered medications prior to visit.    Review of Systems    Objective    BP 130/74 (BP Location: Left Arm, Patient Position: Sitting, Cuff Size: Normal)   Pulse 65   Ht 5\' 1"  (1.549 m)   Wt 113 lb 4.8 oz (51.4 kg)   SpO2 100%   BMI 21.41 kg/m    Physical Exam Vitals reviewed.  Constitutional:      General: She is not in acute distress.    Appearance: Normal appearance. She is well-developed. She is not diaphoretic.  HENT:  Head: Normocephalic and atraumatic.     Right Ear: Tympanic membrane, ear canal and external ear normal.     Left Ear: Tympanic membrane, ear canal and external ear normal.     Nose: Nose normal.     Mouth/Throat:     Mouth: Mucous membranes are moist.     Pharynx: Oropharynx is clear. No oropharyngeal exudate.  Eyes:     General: No scleral icterus.    Conjunctiva/sclera: Conjunctivae normal.     Pupils: Pupils are equal, round, and reactive to light.  Neck:     Thyroid: No thyromegaly.  Cardiovascular:     Rate and Rhythm: Normal rate and regular rhythm.     Heart sounds: Normal heart sounds. No murmur heard. Pulmonary:     Effort: Pulmonary effort is normal. No respiratory distress.     Breath sounds: Normal breath sounds. No wheezing or rales.  Abdominal:     General: There is no distension.     Palpations: Abdomen is  soft.     Tenderness: There is no abdominal tenderness.  Musculoskeletal:        General: No deformity.     Cervical back: Neck supple.     Right lower leg: No edema.     Left lower leg: No edema.  Lymphadenopathy:     Cervical: No cervical adenopathy.  Skin:    General: Skin is warm and dry.     Findings: No rash.  Neurological:     Mental Status: She is alert and oriented to person, place, and time. Mental status is at baseline.     Gait: Gait normal.  Psychiatric:        Mood and Affect: Mood normal.        Behavior: Behavior normal.        Thought Content: Thought content normal.      No results found for any visits on 09/25/23.  Assessment & Plan    Routine Health Maintenance and Physical Exam  Exercise Activities and Dietary recommendations  Goals      DIET - EAT MORE FRUITS AND VEGETABLES     DIET - INCREASE WATER INTAKE     Recommend to drink at least 6-8 8oz glasses of water per day.        Immunization History  Administered Date(s) Administered   Fluad Quad(high Dose 65+) 03/12/2019   Influenza, High Dose Seasonal PF 03/06/2018   Influenza,inj,Quad PF,6+ Mos 02/27/2017   Moderna Sars-Covid-2 Vaccination 07/12/2019, 08/09/2019, 03/21/2020   Pneumococcal Conjugate-13 07/10/2017   Pneumococcal Polysaccharide-23 07/11/2018   Td 10/17/2003   Tdap 01/01/2013   Zoster Recombinant(Shingrix) 12/05/2017, 02/06/2018    Health Maintenance  Topic Date Due   DTaP/Tdap/Td (3 - Td or Tdap) 01/02/2023   COVID-19 Vaccine (4 - 2024-25 season) 01/29/2023   MAMMOGRAM  10/26/2023   INFLUENZA VACCINE  12/29/2023   Medicare Annual Wellness (AWV)  09/19/2024   Colonoscopy  09/24/2024   DEXA SCAN  10/25/2024   Pneumonia Vaccine 70+ Years old  Completed   Hepatitis C Screening  Completed   Zoster Vaccines- Shingrix  Completed   HPV VACCINES  Aged Out   Meningococcal B Vaccine  Aged Out    Discussed health benefits of physical activity, and encouraged her to engage in  regular exercise appropriate for her age and condition.  Problem List Items Addressed This Visit       Cardiovascular and Mediastinum   Elevated coronary artery calcium  score   Relevant Medications  ezetimibe (ZETIA) 10 MG tablet     Musculoskeletal and Integument   Age-related osteoporosis without current pathological fracture   Discontinued Fosamax . Using [employer] supplement for bone health. Discussed importance of calcium , vitamin D, and weight-bearing exercise for bone density. Next bone density scan due in 2026.        Other   Anxiety   No longer on lexapro  Well controlled      Hypercholesteremia   Discontinued rosuvastatin  due to muscle aches. Zetia prescribed as an alternative with fewer side effects. Discussed benefits of statins in reducing heart attack, stroke, and mortality compared to supplements. Consider CoQ10 for muscle aches if needed. Explained that supplements like red yeast rice lower cholesterol but do not reduce heart attack, stroke, and mortality rates as statins do. - Prescribe Zetia - Order labs for cholesterol, kidney, and liver function - Discuss CoQ10 supplementation if muscle aches occur      Relevant Medications   ezetimibe (ZETIA) 10 MG tablet   Other Relevant Orders   Comprehensive metabolic panel with GFR   Lipid panel   Insomnia   Discontinued escitalopram . Using Beam Dream supplement for sleep, which has been effective. Ingredients include magnesium, melatonin, and L-theanine.      Myalgia due to statin   Other Visit Diagnoses       Encounter for annual physical exam    -  Primary   Relevant Orders   Comprehensive metabolic panel with GFR     Breast cancer screening by mammogram       Relevant Orders   MM 3D SCREENING MAMMOGRAM BILATERAL BREAST     Left flank pain       Relevant Orders   Lipase   CT ABDOMEN PELVIS W WO CONTRAST     LUQ abdominal pain       Relevant Orders   CT ABDOMEN PELVIS W WO CONTRAST            Left flank/LUQ pain Persistent left flank/ LUQ pain for several months with no change in severity. Differential includes pancreatic, colonic, or splenic issues. Physical exam unremarkable. - Order CT scan of abdomen - Order lipase test to assess pancreatic function  Muscle aches from statin use Muscle aches attributed to rosuvastatin  use. Discontinued statin and discussed alternative cholesterol management options with fewer side effects.  Wellness Visit Routine wellness visit. Discussed general health maintenance, including upcoming screenings and vaccinations. Mammogram and tetanus vaccination discussed. Upcoming colonoscopy preparation acknowledged. - Order mammogram - Discuss tetanus vaccination - Discuss upcoming colonoscopy        Return in about 1 year (around 09/24/2024) for CPE.     Aden Agreste, MD  [facility] [phone] (phone) [phone] (fax)  [facility]

## 2023-09-25 NOTE — Patient Instructions (Signed)
 Call [facility] to schedule a mammogram [phone]

## 2023-09-26 LAB — LIPID PANEL

## 2023-09-27 ENCOUNTER — Encounter: Payer: Self-pay | Admitting: Family Medicine

## 2023-09-27 LAB — COMPREHENSIVE METABOLIC PANEL WITH GFR
ALT: 22 IU/L (ref 0–32)
AST: 21 IU/L (ref 0–40)
Albumin: 4.6 g/dL (ref 3.8–4.8)
Alkaline Phosphatase: 72 IU/L (ref 44–121)
BUN/Creatinine Ratio: 24 (ref 12–28)
BUN: 15 mg/dL (ref 8–27)
Bilirubin Total: 0.5 mg/dL (ref 0.0–1.2)
CO2: 22 mmol/L (ref 20–29)
Calcium: 9.5 mg/dL (ref 8.7–10.3)
Chloride: 104 mmol/L (ref 96–106)
Creatinine, Ser: 0.62 mg/dL (ref 0.57–1.00)
Globulin, Total: 2.7 g/dL (ref 1.5–4.5)
Glucose: 101 mg/dL — ABNORMAL HIGH (ref 70–99)
Potassium: 4.4 mmol/L (ref 3.5–5.2)
Sodium: 145 mmol/L — ABNORMAL HIGH (ref 134–144)
Total Protein: 7.3 g/dL (ref 6.0–8.5)
eGFR: 95 mL/min/{1.73_m2} (ref >59)

## 2023-09-27 LAB — LIPASE: Lipase: 13 U/L — ABNORMAL LOW (ref 14–85)

## 2023-09-27 LAB — LIPID PANEL
Cholesterol, Total: 271 mg/dL — ABNORMAL HIGH (ref 100–199)
HDL: 71 mg/dL (ref >39)
LDL CALC COMMENT:: 3.8 ratio (ref 0.0–4.4)
LDL Chol Calc (NIH): 167 mg/dL — ABNORMAL HIGH (ref 0–99)
Triglycerides: 181 mg/dL — ABNORMAL HIGH (ref 0–149)
VLDL Cholesterol Cal: 33 mg/dL (ref 5–40)

## 2023-09-29 ENCOUNTER — Ambulatory Visit
Admission: RE | Admit: 2023-09-29 | Discharge: 2023-09-29 | Disposition: A | Discharge status: Home / Self Care | Source: Ambulatory Visit | Attending: Family Medicine | Admitting: Family Medicine

## 2023-09-29 DIAGNOSIS — R1012 Left upper quadrant pain: Secondary | ICD-10-CM | POA: Diagnosis present

## 2023-09-29 DIAGNOSIS — R109 Unspecified abdominal pain: Secondary | ICD-10-CM | POA: Diagnosis present

## 2023-09-29 MED ORDER — IOHEXOL 300 MG/ML  SOLN
80.0000 mL | Freq: Once | INTRAMUSCULAR | Status: AC | PRN
  Administered 2023-09-29: 80 mL via INTRAVENOUS

## 2023-09-30 ENCOUNTER — Other Ambulatory Visit: Payer: Self-pay | Admitting: Family Medicine

## 2023-10-02 ENCOUNTER — Encounter: Payer: Self-pay | Admitting: Family Medicine

## 2023-10-10 ENCOUNTER — Encounter: Payer: Self-pay | Admitting: Family Medicine

## 2023-10-13 ENCOUNTER — Other Ambulatory Visit: Payer: Self-pay

## 2023-10-13 MED ORDER — ESCITALOPRAM OXALATE 5 MG PO TABS: 5.0000 mg | ORAL_TABLET | Freq: Every day | ORAL | 5 refills | Status: DC

## 2023-10-13 NOTE — Telephone Encounter (Signed)
 Ok to re-order her lexapro  (last dose was 10mg  daily, but recommend she start back on 5mg  daily) - Lexapro  5mg  daily #30 r5.  Please let patient know.

## 2023-11-07 ENCOUNTER — Ambulatory Visit
Admission: RE | Admit: 2023-11-07 | Discharge: 2023-11-07 | Disposition: A | Discharge status: Home / Self Care | Source: Ambulatory Visit | Attending: Family Medicine | Admitting: Family Medicine

## 2023-11-07 DIAGNOSIS — Z1231 Encounter for screening mammogram for malignant neoplasm of breast: Secondary | ICD-10-CM | POA: Diagnosis not present

## 2023-11-09 ENCOUNTER — Ambulatory Visit: Payer: Self-pay | Admitting: Family Medicine

## 2023-12-01 ENCOUNTER — Other Ambulatory Visit: Payer: Self-pay | Admitting: Family Medicine

## 2023-12-01 DIAGNOSIS — E7800 Pure hypercholesterolemia, unspecified: Secondary | ICD-10-CM

## 2024-02-20 NOTE — Progress Notes (Signed)
 Pharmacy Quality Measure Review  This patient is appearing on a report for being at risk of failing the adherence measure for cholesterol (statin) medications this calendar year.   Medication: rosuvastatin   Medication was discontinued on 09/25/23 d/t myalgias. No action needed at this time.  Madisson Kulaga E. Marsh, PharmD Clinical Pharmacist [facility] [phone]

## 2024-04-01 ENCOUNTER — Encounter: Payer: Self-pay | Admitting: Family Medicine

## 2024-04-15 ENCOUNTER — Ambulatory Visit (INDEPENDENT_AMBULATORY_CARE_PROVIDER_SITE_OTHER): Admitting: Family Medicine

## 2024-04-15 ENCOUNTER — Encounter: Payer: Self-pay | Admitting: Family Medicine

## 2024-04-15 VITALS — BP 138/76 | HR 66 | Resp 14 | Ht 61.0 in | Wt 112.0 lb

## 2024-04-15 DIAGNOSIS — F5104 Psychophysiologic insomnia: Secondary | ICD-10-CM

## 2024-04-15 DIAGNOSIS — F419 Anxiety disorder, unspecified: Secondary | ICD-10-CM | POA: Diagnosis not present

## 2024-04-15 MED ORDER — DOXEPIN HCL 3 MG PO TABS: 3.0000 mg | ORAL_TABLET | Freq: Every evening | ORAL | 2 refills | Status: AC | PRN

## 2024-04-15 MED ORDER — ESCITALOPRAM OXALATE 5 MG PO TABS: 2.5000 mg | ORAL_TABLET | Freq: Every day | ORAL | 0 refills | Status: AC

## 2024-04-15 NOTE — Assessment & Plan Note (Signed)
 Chronic insomnia with difficulty initiating sleep. Current regimen includes magnesium, l-theanine, and melatonin supplements, which have been somewhat effective. Considering doxepin as an as-needed option for sleep initiation, especially during travel. Discussed the limitations of current sleep medications and the potential benefits of doxepin, which has fewer side effects compared to other sleep aids like Ambien  and Lunesta. CBD was discussed as an alternative, but concerns about trace THC content were noted. - Prescribed doxepin 3 mg, instructed to take 1-2 pills as needed, 30 minutes before desired sleep time. - Continue current supplement regimen with magnesium, l-theanine, and melatonin.

## 2024-04-15 NOTE — Progress Notes (Signed)
 Established patient visit   Patient: Robin Stephens   DOB: [birth year]   73 y.o. Female  MRN: [id number] Visit Date: 04/15/2024  Today's healthcare provider: Jon Eva, MD   Chief Complaint  Patient presents with   Insomnia    Ongoing 10 years has been using supplements. Would like to come off from lexapro    Subjective    Insomnia   HPI     Insomnia    Additional comments: Ongoing 10 years has been using supplements. Would like to come off from lexapro       Last edited by Wilfred Hargis RAMAN, CMA on 04/15/2024 11:04 AM.       Discussed the use of AI scribe software for clinical note transcription with the patient, who gave verbal consent to proceed.  History of Present Illness   Robin Stephens is a 73 year old female who presents with ongoing sleep disturbances and concerns about Lexapro  side effects.  She experiences sleep disturbances despite using magnesium, l-theanine, and melatonin, which are generally effective. Disrupted sleep occurs occasionally, with no consistent patterns identified through her aura ring tracking.  She takes Lexapro  5 mg, initially effective but now less so. It causes difficulty in achieving climax without dryness, with some improvement noted at the reduced dose.  She has tried CBD for sleep without success and is currently using beet supplements for blood pressure management, monitoring her effects. Her anxiety is managed through planning and organization, avoiding a 'zombie-like' feeling from medication.        Medications: Outpatient Medications Prior to Visit  Medication Sig   Ginger, Zingiber officinalis, 550 MG CAPS Take by mouth daily.    MULTIPLE VITAMINS PO Take by mouth daily.    Omega-3 Fatty Acids (FISH OIL) 1200 MG CAPS Take by mouth daily.    omeprazole  (PRILOSEC) 20 MG capsule Take 1 capsule (20 mg total) by mouth daily.   TURMERIC PO Take by mouth daily. Unsure dose   [DISCONTINUED] escitalopram  (LEXAPRO ) 5 MG  tablet Take 1 tablet (5 mg total) by mouth daily.   [DISCONTINUED] escitalopram  (LEXAPRO ) 5 MG tablet Take 5 mg by mouth daily. (Patient not taking: Reported on 04/15/2024)   [DISCONTINUED] ezetimibe  (ZETIA ) 10 MG tablet Take 1 tablet (10 mg total) by mouth daily.   No facility-administered medications prior to visit.    Review of Systems  Psychiatric/Behavioral:  The patient has insomnia.        Objective    BP 138/76 (BP Location: Left Arm, Patient Position: Sitting, Cuff Size: Normal)   Pulse 66   Resp 14   Ht 5' 1 (1.549 m)   Wt 112 lb (50.8 kg)   SpO2 99%   BMI 21.16 kg/m    Physical Exam Vitals reviewed.  Constitutional:      General: She is not in acute distress.    Appearance: Normal appearance. She is well-developed. She is not diaphoretic.  HENT:     Head: Normocephalic and atraumatic.  Eyes:     General: No scleral icterus.    Conjunctiva/sclera: Conjunctivae normal.  Neck:     Thyroid: No thyromegaly.  Cardiovascular:     Rate and Rhythm: Normal rate and regular rhythm.     Heart sounds: Normal heart sounds. No murmur heard. Pulmonary:     Effort: Pulmonary effort is normal. No respiratory distress.     Breath sounds: Normal breath sounds. No wheezing, rhonchi or rales.  Musculoskeletal:     Cervical back:  Neck supple.     Right lower leg: No edema.     Left lower leg: No edema.  Lymphadenopathy:     Cervical: No cervical adenopathy.  Skin:    General: Skin is warm and dry.     Findings: No rash.  Neurological:     Mental Status: She is alert and oriented to person, place, and time. Mental status is at baseline.  Psychiatric:        Mood and Affect: Mood normal.        Behavior: Behavior normal.      No results found for any visits on 04/15/24.  Assessment & Plan     Problem List Items Addressed This Visit       Other   Anxiety   Long-term use of Lexapro  5 mg, which is no longer effective and may be contributing to decreased libido.  Anxiety is managed through planning and is not significantly bothersome. Plan to taper off Lexapro  to avoid withdrawal symptoms. - Taper Lexapro  from 5 mg to 2.5 mg for two weeks, then discontinue. - Provided 7 pills of 5 mg Lexapro  to facilitate tapering if needed.      Relevant Medications   escitalopram  (LEXAPRO ) 5 MG tablet   Insomnia - Primary   Chronic insomnia with difficulty initiating sleep. Current regimen includes magnesium, l-theanine, and melatonin supplements, which have been somewhat effective. Considering doxepin as an as-needed option for sleep initiation, especially during travel. Discussed the limitations of current sleep medications and the potential benefits of doxepin, which has fewer side effects compared to other sleep aids like Ambien  and Lunesta. CBD was discussed as an alternative, but concerns about trace THC content were noted. - Prescribed doxepin 3 mg, instructed to take 1-2 pills as needed, 30 minutes before desired sleep time. - Continue current supplement regimen with magnesium, l-theanine, and melatonin.           Elevated Blood Pressure Blood pressure has been variable, with recent readings showing improvement. Currently taking a beet supplement, which has shown positive effects in other patients. - Continue beet supplement.       Return for as scheduled.       Jon Eva, MD  [facility] [phone] (phone) [phone] (fax)  [facility]

## 2024-04-15 NOTE — Assessment & Plan Note (Signed)
 Long-term use of Lexapro  5 mg, which is no longer effective and may be contributing to decreased libido. Anxiety is managed through planning and is not significantly bothersome. Plan to taper off Lexapro  to avoid withdrawal symptoms. - Taper Lexapro  from 5 mg to 2.5 mg for two weeks, then discontinue. - Provided 7 pills of 5 mg Lexapro  to facilitate tapering if needed.

## 2024-05-13 ENCOUNTER — Encounter: Payer: Self-pay | Admitting: Family Medicine

## 2024-05-13 ENCOUNTER — Ambulatory Visit: Admitting: Family Medicine

## 2024-05-13 VITALS — BP 153/77 | HR 67 | Ht 61.0 in | Wt 111.0 lb

## 2024-05-13 DIAGNOSIS — M62838 Other muscle spasm: Secondary | ICD-10-CM

## 2024-05-13 MED ORDER — METHOCARBAMOL 750 MG PO TABS: 750.0000 mg | ORAL_TABLET | Freq: Three times a day (TID) | ORAL | 1 refills | Status: DC | PRN

## 2024-05-13 NOTE — Progress Notes (Signed)
 Acute visit   Patient: Robin Stephens   DOB: Aug 17, 1950   73 y.o. Female  MRN: [id number] PCP: Myrla Jon HERO, MD   Chief Complaint  Patient presents with   Injections    Discuss Cortisone injection between neck/edge of shoulder. She reports she has never had one and would like to find out about it as she has tried several other things with no resolution after several months. Worse at night that she is not able to get comfortable and disrupting sleeping.   Subjective    Discussed the use of AI scribe software for clinical note transcription with the patient, who gave verbal consent to proceed.  History of Present Illness   Robin Stephens is a 73 year old female who presents with bilateral neck and shoulder pain.  She has had bilateral pain between her neck and shoulders since the summer, worse on the left, and most intense at night, which interferes with sleep.  She has tried topical patches, posture changes, and different pillows with limited relief. Heat usually worsens the pain, though warm baths help somewhat.  She has tried exercises, physical therapy, and stretching without significant improvement and has reduced arm-swinging exercises to avoid worsening symptoms.  She has never had a cortisone injection. She takes extended-release Tylenol at night for generalized aches and uses ibuprofen as needed for acute pain.  She has tried doxepin  and glycine for sleep and notes some improvement in falling asleep.        Review of Systems  Objective    BP (!) 153/77 (BP Location: Left Arm, Patient Position: Sitting, Cuff Size: Normal)   Pulse 67   Ht 5' 1 (1.549 m)   Wt 111 lb (50.3 kg)   SpO2 98%   BMI 20.97 kg/m  Physical Exam Musculoskeletal:        General: Normal range of motion.     Comments: Tightness of bilateral trapezius muscles       No results found for any visits on 05/13/24.  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 Symptoms worsen at night. Previous interventions include heat, posture adjustments, foam pillow, copper sleeves, and exercises with limited relief. Heat aggravates symptoms. Cortisone injections are not suitable for muscle spasm. Methocarbamol  is preferred over Flexeril for muscle relaxation. Dry needling may be considered if methocarbamol  is ineffective. - Prescribed methocarbamol  for nighttime use to relax muscles and improve sleep. - Consider dry needling through physical therapy if methocarbamol  is ineffective. - Continue current exercises and posture adjustments. - Consider using a Theragun or similar device for rapid tension release. - Continue using ibuprofen as needed for pain relief.       Meds ordered this encounter  Medications   methocarbamol  (ROBAXIN ) 750 MG tablet    Sig: Take 1 tablet (750 mg total) by mouth every 8 (eight) hours as needed for muscle spasms.    Dispense:  30 tablet    Refill:  1     Return if symptoms worsen or fail to improve.      Jon Myrla, MD  [facility] [phone] (phone) [phone] (fax)  [facility]

## 2024-05-25 ENCOUNTER — Encounter: Payer: Self-pay | Admitting: Family Medicine

## 2024-05-27 ENCOUNTER — Other Ambulatory Visit: Payer: Self-pay

## 2024-05-27 DIAGNOSIS — M62838 Other muscle spasm: Secondary | ICD-10-CM

## 2024-05-27 NOTE — Telephone Encounter (Signed)
 Ok to place amb ref to PT (specify dry needling in the comments). Use dx code from last visit.

## 2024-06-03 DIAGNOSIS — F5104 Psychophysiologic insomnia: Secondary | ICD-10-CM

## 2024-06-03 MED ORDER — SERTRALINE HCL 50 MG PO TABS: 50.0000 mg | ORAL_TABLET | Freq: Every day | ORAL | 3 refills | Status: AC

## 2024-06-18 ENCOUNTER — Other Ambulatory Visit: Payer: Self-pay | Admitting: Family Medicine

## 2024-06-20 NOTE — Therapy (Signed)
 " OUTPATIENT PHYSICAL THERAPY  EVALUATION   Patient Name: Robin Stephens MRN: [id number] DOB:1951/03/15, 74 y.o., female Today's Date: 06/26/2024  END OF SESSION:  PT End of Session - 06/26/24 0853     Visit Number 1    Number of Visits 17    Date for Recertification  09/18/24    Authorization Type [employer]IC[employer] CHOICE PPO reporting period from 06/26/2024    Authorization Time Period needs auth    Authorization - Visit Number 1    Authorization - Number of Visits 1    Progress Note Due on Visit 10    PT Start Time 0856    PT Stop Time 0941    PT Time Calculation (min) 45 min    Activity Tolerance Patient tolerated treatment well;No increased pain    Behavior During Therapy [facility] for tasks assessed/performed          Past Medical History:  Diagnosis Date   Age-related osteoporosis without current pathological fracture 11/03/2022   Allergy    Anxiety On occasion   Gastroesophageal reflux disease without esophagitis 11/03/2022   Past Surgical History:  Procedure Laterality Date   APPENDECTOMY  2006   EYE SURGERY     PRK   Patient Active Problem List   Diagnosis Date Noted   Elevated coronary artery calcium  score 09/25/2023   Myalgia due to statin 09/25/2023   Age-related osteoporosis without current pathological fracture 11/03/2022   Gastroesophageal reflux disease without esophagitis 11/03/2022   Family history of heart disease 08/31/2020   Anxiety 01/21/2015   DD (diverticular disease) 01/21/2015   Hypercholesteremia 01/21/2015   Insomnia 01/21/2015    PCP: Myrla Jon HERO, MD  REFERRING PROVIDER: Myrla Jon HERO, MD  REFERRING DIAG: Trapezius muscle spasm  THERAPY DIAG:  Cervicalgia  Other muscle spasm  Rationale for Evaluation and Treatment: Rehabilitation  ONSET DATE: July 2025  SUBJECTIVE:                                                                                                                                                                                                          SUBJECTIVE STATEMENT: Patient states pain started 6 months ago and progressively got worse. Tries to keep ipad up at eye level to decrease risk of exacerbating pain. Pain jumps from one side (of upper trap) to the other. Worse when she is trying to sleep because of positioning. Does eliptical 6x a week but has started to not use the arm part to not exacerbate sx. She does lift weights but its low weight and  she cut out the exercises that she thought would upset the area. She explained lowering the loads in her exercises to not exacerbate pain and displayed feeling frustrated that nothing she's been trying has worked towards improving her neck.   PERTINENT HISTORY:  Patient is a 74 y.o. female who presents to outpatient physical therapy with a referral for medical diagnosis Trapezius muscle spasm. This patient's chief complaints consist of pain in upper trap more present in left side, leading to the following functional deficits: limitations in ADL, lifting heavy, looking down. Relevant past medical history and comorbidities include the following: she has Anxiety; DD (diverticular disease); Hypercholesteremia; Insomnia; Family history of heart disease; Age-related osteoporosis without current pathological fracture; Gastroesophageal reflux disease without esophagitis; Elevated coronary artery calcium  score; and Myalgia due to statin on their problem list. she  has a past medical history of Age-related osteoporosis without current pathological fracture (11/03/2022), Allergy, Anxiety (On occasion), and Gastroesophageal reflux disease without esophagitis (11/03/2022). she  has a past surgical history that includes Appendectomy (2006) and Eye surgery.    PAIN:   Are you having pain? Yes, slightly uncomfortable NPRS: Current: 1-2/10,  Best: 1-2/10, Worst: 7/10. Pain location: upper trap area on both sides but mostly left Pain description: achey pain N/T:  none Aggravating factors: lifting heavy things Relieving factors: enjoys using heating pad, litocaine patches, thermo patches that keep it warm, biofreeze throughout day    PRECAUTIONS: None   Patient denies hx of cancer, stroke, seizures, lung problems, heart problems, diabetes, unexplained weight loss, unexplained changes in bowel or bladder problems, unexplained stumbling or dropping things, and spinal surgery Has osteoporosis  Mentioned taking zoloft  2 wks ago, and felt so dizzy from it that now she's easing off of it wit MD, she said this in regards to being asked about unexplained stumbling.    WEIGHT BEARING RESTRICTIONS: No  FALLS:  Has patient fallen in last 6 months? 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  DIAGNOSTIC FINDINGS:  There was no relevant diagnostic findings per chart review   NECK DISABILITY INDEX  Date: 06/26/2024 Score  Pain intensity 1 = The pain is very mild at the moment  2. Personal care (washing, dressing, etc.) 0 = I can look after myself normally without causing extra pain  3. Lifting 4 =  I can only lift very light weights  4. Reading 1 = I can read as much as I want to with slight pain in my neck  5. Headaches 0 = I have no headaches at all  6. Concentration 1 =  I can concentrate fully when I want to with slight difficulty   7. Work 2 = I can do most of my usual work, but no more  8. Driving 0 = I can drive my car without any neck pain  9. Sleeping 2 = My sleep is mildly disturbed (1-2 hrs sleepless)  10. Recreation 1 =  I am able to engage in all my recreation activities, with some pain in my neck  Total 12/50 (24%)   Minimum Detectable Change (90% confidence): 5 points or  10% points  COGNITION: Overall cognitive status: Within functional limits for tasks assessed   POSTURE: rounded  shoulders, right shoulder lower than left with more prominent upper trap region on the left  PALPATION: TTP left upper trap, with muscle tightness present   CERVICAL ROM:   Active ROM A/PROM (deg) eval  Flexion 3 finger widths from bringing fingers to chest  Extension   Right lateral flexion WNL  Left lateral flexion WNL  Right rotation not able to bring chin/nose to be over shoulder, concordant pain and limited  Left rotation No painful and less limited than R    (Blank rows = not tested)  UPPER EXTREMITY ROM:  Active ROM with OP Right eval Left eval  Shoulder flexion  WNL Concordant pain in upper trap with OP  Shoulder extension    Shoulder abduction  WNL Tightness in concordant spot with OP   Shoulder adduction    Shoulder extension    Shoulder internal rotation  WNL WNL  Shoulder external rotation  WNL WNL  Elbow flexion    Elbow extension    Wrist flexion    Wrist extension    Wrist ulnar deviation    Wrist radial deviation    Wrist pronation    Wrist supination     (Blank rows = not tested)  UPPER EXTREMITY MMT:  MMT Right eval Left eval  Shoulder flexion  4/5 4/5  Shoulder extension    Shoulder abduction  4/5 4/5  Shoulder adduction    Shoulder extension    Shoulder internal rotation  4/5 4/5  Shoulder external rotation  4/5 4/5  Middle trapezius  4/5 4/5  Lower trapezius  4/5 4/5  Elbow flexion    Elbow extension    Wrist flexion    Wrist extension    Wrist ulnar deviation    Wrist radial deviation    Wrist pronation    Wrist supination    Grip strength     (Blank rows = not tested)  MMT upper trap: 5/5  CERVICAL SPECIAL TESTS:  Spurling's test: Negative Traction in sitting: negative  TREATMENT DATE: 06/26/2024                                                                                                                                Therapeutic exercise: therapeutic exercises that incorporate ONE parameter at one or more areas of the body to centralize symptoms, develop strength and endurance, range of motion, and flexibility required for successful completion of functional activities.  Seated Upper Trapezius Stretch to improve cervical ROM to reduce neck tension and support upright posture   1 x 10 with 3 second hold   Added to HEP   Seated Assisted Cervical Rotation with Towel to improve cervical ROM to reduce neck tension and support upright posture   1 x 10 with 3 second hold  Added to HEP   Pt required multimodal cuing for proper technique and to facilitate improved neuromuscular control, strength, range of motion, and functional ability resulting in improved performance and form.  PATIENT EDUCATION:  Education details: Patient educated on how to perform stretch with appropriate technique Person educated: Patient Education method: Medical Illustrator Education comprehension: verbalized understanding and returned demonstration  HOME EXERCISE PROGRAM: Access Code: [email or web address] URL: [email or web address] Date: 06/26/2024 Prepared by: Vernell Mariscal  Exercises - Seated Assisted Cervical Rotation with Towel  - 1-2 x daily - 7 x weekly - 3 sets - 10 reps - 3 second hold - Seated Upper Trapezius Stretch  - 1-2 x daily - 7 x weekly - 3 sets - 10 reps - 3 second hold  ASSESSMENT:  CLINICAL IMPRESSION:  Patient is a 74 y.o. female referred to outpatient physical therapy with a medical diagnosis of Trapezius Muscle Spasm who presents with signs and symptoms consistent with mechanical neck pain with hypomobility. Patient presents with limited ROM in cervical flexion and right rotation that are limiting her ability to complete lifting, looking down for cooking and reading, sleeping. She also has increased tension and pain to touch in her left > right upper trap region which is also  contributing to her pain. After evaluation of patients limitations it may be beneficial to perform STM or dry needling to achieve desired effect of decreasing tension and improving motion. Patient will benefit from skilled physical therapy intervention to address current body structure impairments and activity limitations to improve function and work towards goals set in current POC in order to return to prior level of function or maximal functional improvement.   Mechanical sensitivities: pressure sensitivity, myofascial trigger points    OBJECTIVE IMPAIRMENTS: decreased activity tolerance, decreased ROM, decreased strength, hypomobility, increased muscle spasms, impaired UE functional use, postural dysfunction, and pain.   ACTIVITY LIMITATIONS: carrying, lifting, bending, sleeping, and sleeping, recreational activities, any activity causing her to look down for long periods causes her pain and discomfort  PARTICIPATION LIMITATIONS: traveling, recreational activities, women's group  PERSONAL FACTORS: Age, Fitness, Past/current experiences, Time since onset of injury/illness/exacerbation, and 3+ comorbidities: osteoporosis, GERD, anxiety are also affecting patient's functional outcome.   REHAB POTENTIAL: Good  CLINICAL DECISION MAKING: Stable/uncomplicated  EVALUATION COMPLEXITY: Low   GOALS: GOALS: Goals reviewed with patient? No  SHORT TERM GOALS: Target date: 07/10/2024  Patient will be independent with initial home exercise program for self-management of symptoms. Baseline: Initial HEP provided at IE (06/26/24); Goal status: INITIAL  LONG TERM GOALS: Target date: 09/18/2024  Patient will be independent with a long-term home exercise program for self-management of symptoms.  Baseline: Initial HEP provided at IE (06/26/24); Goal status: INITIAL  2.  Patient will demonstrate improved Neck Disability Index (NDI) to equal or less than 10% to demonstrate improvement in overall  condition and self-reported functional ability.  Baseline: 24% (06/26/24); Goal status: INITIAL  3.  Patient will improve overall cervical ROM to decrease limitations in reading, sleeping, and viewing her surroundings.  Baseline: limited in flexion and right rotation - see objective (06/26/24); Goal status: INITIAL  4.  Patient will demonstrate improved strength and tolerance to lifting heavier weights without reproduction of pain or compensatory movements by being able to complete 10 consecutive OH presses with a difficult weight as defined by the patient without increased concordant symptoms to improve her ability to complete exercise training and travel. Baseline: to be measured at visit 2 as appropriate (06/26/24); Goal status: INITIAL  5.  Patient will demonstrate improvement in Patient Specific Functional Scale (PSFS) of equal or greater than 8/10 points to reflect clinically significant improvement in patient's most valued functional activities. Baseline: to be  measured at visit 2 as appropriate (06/26/24); Goal status: INITIAL    PLAN:  PT FREQUENCY: 1-2x/week  PT DURATION: 12 weeks  PLANNED INTERVENTIONS: 97164- PT Re-evaluation, 97750- Physical Performance Testing, 97110-Therapeutic exercises, 97530- Therapeutic activity, [id number]- Neuromuscular re-education, 97535- Self Care, 02859- Manual therapy, [id number]- Electrical stimulation (unattended), 20560 (1-2 muscles), 20561 (3+ muscles)- Dry Needling, Patient/Family education, Joint mobilization, Cryotherapy, and Moist heat  PLAN FOR NEXT SESSION: Consider doing dry needling at next session. Consider asking about abuse/literacy questions, take patient BP, Consider taking PSFS for long term goals, Consider looking at cervical extension ROM and cervical retraction as treatment options, review HEP and update as needed.   Vernell Mariscal SPT  Student physical therapist under direct supervision of licensed physical therapists during the  entirety of the session.   Camie SAUNDERS. Juli, PT, DPT, Cert. MDT, PRA-C 06/26/24, 4:04 PM  [facility] [address] [place], [place] [address] P: [phone] I F: [phone]     "

## 2024-06-25 ENCOUNTER — Encounter: Payer: Self-pay | Admitting: Family Medicine

## 2024-06-25 NOTE — Therapy (Unsigned)
 " OUTPATIENT PHYSICAL THERAPY EVALUATION   Patient Name: Robin Stephens MRN: [id number] DOB:[birth year], 74 y.o., female Today's Date: 06/25/2024  END OF SESSION:   Past Medical History:  Diagnosis Date   Age-related osteoporosis without current pathological fracture 11/03/2022   Allergy    Anxiety On occasion   Gastroesophageal reflux disease without esophagitis 11/03/2022   Past Surgical History:  Procedure Laterality Date   APPENDECTOMY  2006   EYE SURGERY     PRK   Patient Active Problem List   Diagnosis Date Noted   Elevated coronary artery calcium  score 09/25/2023   Myalgia due to statin 09/25/2023   Age-related osteoporosis without current pathological fracture 11/03/2022   Gastroesophageal reflux disease without esophagitis 11/03/2022   Family history of heart disease 08/31/2020   Anxiety 01/21/2015   DD (diverticular disease) 01/21/2015   Hypercholesteremia 01/21/2015   Insomnia 01/21/2015    PCP: Myrla Jon HERO, MD  REFERRING PROVIDER: Myrla Jon HERO, MD  REFERRING DIAG: trapezius muscle spasm  THERAPY DIAG:  No diagnosis found.  Rationale for Evaluation and Treatment: Rehabilitation  ONSET DATE: ***  SUBJECTIVE:                                                                                                                                                                                                         SUBJECTIVE STATEMENT:  What are your expectations for today? *** Manner of onset (traumatic, sudden, insidious): ***. When did it start? *** Related signs and symptoms: *** Previous episodes: *** Occupation: *** Recreational Activities and hobbies: *** Functional limitations: ***  PAIN: Are you having pain? Yes NPRS: Current: ***/10,  Best: ***/10, Worst: ***/10. Pain location: *** Pain description: *** . Numbness/tingling: *** Aggravating factors: *** Relieving factors: ***  24 hour clock: *** Irritability of the pain or  symptoms:  Time to onset (T1): ***,  Time to quit (T2): *** Time to feel better (T3): ***     PERTINENT HISTORY:  Patient is a 74 y.o. female who presents to outpatient physical therapy with a referral for medical diagnosis trapezius muscle spasm. This patient's chief complaints consist of ***, leading to the following functional deficits: ***. Relevant past medical history and comorbidities include the following: she has Anxiety; DD (diverticular disease); Hypercholesteremia; Insomnia; Family history of heart disease; Age-related osteoporosis without current pathological fracture; Gastroesophageal reflux disease without esophagitis; Elevated coronary artery calcium  score; and Myalgia due to statin on their problem list. she  has a past medical history of Age-related osteoporosis without current pathological fracture (11/03/2022), Allergy, Anxiety (On occasion),  and Gastroesophageal reflux disease without esophagitis (11/03/2022). she  has a past surgical history that includes Appendectomy (2006) and Eye surgery. Patient denies hx of {redflags:27294}  Exercise history: ***    PAIN:  Are you having pain? {OPRCPAIN:27236}  PRECAUTIONS: {Therapy precautions:24002}  WEIGHT BEARING RESTRICTIONS: {Yes ***/No:24003}  FALLS:  Has patient fallen in last 6 months? {fallsyesno:27318}  LIVING ENVIRONMENT: Lives with: {OPRC lives with:25569::lives with their family} Lives in: {Lives in:25570} Stairs: {opstairs:27293} Has following equipment at home: {Assistive devices:23999}  OCCUPATION: ***  PLOF: {PLOF:24004}  PATIENT GOALS: ***  NEXT MD VISIT: ***  OBJECTIVE  DIAGNOSTIC FINDINGS:  None recent in the chart per review 06/25/24  SELF-REPORTED FUNCTION NECK DISABILITY INDEX  Date: *** Score  Pain intensity {NDI-1:32931}  2. Personal care (washing, dressing, etc.) {NDI-2:32932}  3. Lifting {NDI-3:32933}  4. Reading {NDI-4:32934}  5. Headaches {NDI-5:32935}  6. Concentration  {NDI-6:32936}  7. Work {NDI-7:32937}  8. Driving {WIP-1:67061}  9. Sleeping {NDI-9:32939}  10. Recreation {NDI-10:32940}  Total ***/50  Minimum Detectable Change (90% confidence): 5 points or 10% points   SITTING Observation Posture: *** Movement patterns and painful movements: ***  AROM Cervical Spine AROM: Flexion: *** Extension: *** Rotation:  R: ***  L: *** Side Flexion:  R: *** L: *** Protraction: *** Retraction: *** (Least painful directions first, most painful last)  Traction Alleviation Test *** (Only need to test in one direction In neutral if painful in neutral If not, then pick least painful direction, move to point of pain and test in this one position) Symptom Regionalization Flexion: Upper cspine: *** Thoracic: *** Extension:  Upper cspine: *** Thoracic: *** Rotation:  Upper cspine: *** Thoracic: *** Neurological Testing Reflex Testing Biceps tendon (C5/C6) R: *** L: *** Wrist extensors (C6) R: *** L: *** Triceps tendon (C7) R: *** L: *** Pronator quadratus (C7) R: *** L: *** Common wrist flexor tendons (C8) R: *** L: *** Myotome Testing Shoulder ER (C5) R: *** L: *** Biceps brachii (C5/C6) - Wrist extension (C6) R: *** L: *** Triceps (C7) R: *** L: *** MCP extension (C7) R: *** L: *** Abductor digiti minimi (C8) - flexor digitorum (C8) R: *** L: *** Interossei (T1) R: *** L: *** Neural Tension Screening test *** Safety Tests Sharp Pursur (transverse ligament):  Screen: *** (with upper cervical flexion (nose nod) followed by cervical flexion AROM. If pt reports symtposm of dizziness, gloom and doom with prescreen, perform sharp purser) Test: *** Alar Ligament: *** Circulatory Provocation  AROM rotation to dizziness/lightheaded symptoms: *** Fixate eyes. Hold there. Increased symptoms may be vascular, decreased symptoms less likely circulatory more likely vestibular. if symptoms decrease after 5-10 seconds not  vascular.  Neck rotation with head still: *** Upper Cervical Testing Pain localization  Painful Rotation OA: *** AA: *** C2-C3: ***  Painful Sidebending C2-C3: *** OA: *** AA: *** Mobility Assessment OA rotation: ***  SUPINE Myotome Retest Without traction (retest of weak myotomes in sitting) Shoulder ER (C5) R: *** L: *** Biceps brachii (C5/C6) - Wrist extension (C6) R: *** L: *** Triceps (C7) R: *** L: *** MCP extension (C7) R: *** L: *** Abductor digiti minimi (C8) - flexor digitorum (C8) R: *** L: *** Interossei (T1) R: *** L: *** With traction (retest of weak myotomes in sitting) Shoulder ER (C5) R: *** L: *** Biceps brachii (C5/C6) - Wrist extension (C6) R: *** L: *** Triceps (C7) R: *** L: *** MCP extension (C7) R: *** L: *** Abductor digiti minimi (C8) - flexor digitorum (  C8) R: *** L: *** Interossei (T1) R: *** L: *** Quick Test (C2 and below) *** Neural Tension Testing Median R: *** (adhesion? ***) L: *** (adhesion? ***) Ulnar R: *** (adhesion? ***) L: *** (adhesion? ***) Radial R: *** (adhesion? ***) L: *** (adhesion? ***) Safety Tests Transverse Ligament:  CPA C2 spinous process: *** Side glide at AA joint: *** Alar Ligament: *** Palpation *** Deep cervical flexor assessment (if not in pain) Nod (coordination): *** Nod, lift, hold (muscular endurance): ***  SIDELYING Thoracic PIVM assessment Anterior to posterior *** Rotary ***  PRONE Psoas stiffness and end feel Compare sides Anterior capsule differentiation with sidebending as needed   TREATMENT                                                                                                                              PATIENT EDUCATION:  Education details: *** Person educated: {Person educated:25204} Education method: {Education Method:25205} Education comprehension: {Education Comprehension:25206}  HOME EXERCISE PROGRAM: ***  ASSESSMENT:  CLINICAL  IMPRESSION: Patient is a 74 y.o. female referred to outpatient physical therapy with a medical diagnosis of trapezius muscle spasm who presents with signs and symptoms consistent with ***. Patient presents with significant *** impairments that are limiting ability to complete *** without difficulty. Patient will benefit from skilled physical therapy intervention to address current body structure impairments and activity limitations to improve function and work towards goals set in current POC in order to return to prior level of function or maximal functional improvement.   Mechanical sensitivities: ***   OBJECTIVE IMPAIRMENTS: {opptimpairments:25111}.   ACTIVITY LIMITATIONS: {activitylimitations:27494}  PARTICIPATION LIMITATIONS: {participationrestrictions:25113}  PERSONAL FACTORS: {Personal factors:25162} are also affecting patient's functional outcome.   REHAB POTENTIAL: {rehabpotential:25112}  CLINICAL DECISION MAKING: {clinical decision making:25114}  EVALUATION COMPLEXITY: {Evaluation complexity:25115}   GOALS: Goals reviewed with patient? {yes/no:20286}  SHORT TERM GOALS: Target date: 07/09/2024  Patient will be independent with initial home exercise program for self-management of symptoms. Baseline: {HEPbaseline4:27310} (06/25/24); Goal status: INITIAL  LONG TERM GOALS: Target date: 09/17/2024  Patient will be independent with a long-term home exercise program for self-management of symptoms.  Baseline: {HEPbaseline4:27310} (06/25/24); Goal status: INITIAL  2.  Patient will demonstrate improved {SarasLTGPRO:32233} to demonstrate improvement in overall condition and self-reported functional ability.  Baseline: {Sarasgoalbaseline:32234} (06/25/24); Goal status: INITIAL  3.  *** Baseline: {Sarasgoalbaseline:32234} (06/25/24); Goal status: INITIAL  4.  *** Baseline: {Sarasgoalbaseline:32234} (06/25/24); Goal status: INITIAL  5.  Patient will demonstrate improvement  in Patient Specific Functional Scale (PSFS) of equal or greater than 8/10 points to reflect clinically significant improvement in patient's most valued functional activities. Baseline: {Sarasgoalbaseline:32234} (06/25/24); Goal status: INITIAL  6.  Patient will report NPRS equal or less than 3/10 during functional activities during the last 2 weeks to improve their abilitly to complete community, work and/or recreational activities with less limitation. Baseline: ***/10 (06/25/24); Goal status: INITIAL    PLAN:  PT FREQUENCY: {rehab frequency:25116}  PT DURATION: {rehab  duration:25117}  PLANNED INTERVENTIONS: {rehab planned interventions:25118::97110-Therapeutic exercises,97530- Therapeutic [phone]- Neuromuscular re-education,97535- Self Rjmz,02859- Manual therapy,Patient/Family education}  PLAN FOR NEXT SESSION: ***  Vara Mairena R. Juli, PT, DPT, Cert. MDT, PRA-C 06/25/24, 6:03 PM  [facility] [address] [place], [place] [address] P: [phone] I F: [phone]       "

## 2024-06-26 ENCOUNTER — Encounter: Payer: Self-pay | Admitting: Physical Therapy

## 2024-06-26 ENCOUNTER — Ambulatory Visit: Attending: Family Medicine | Admitting: Physical Therapy

## 2024-06-26 DIAGNOSIS — M62838 Other muscle spasm: Secondary | ICD-10-CM | POA: Insufficient documentation

## 2024-06-26 DIAGNOSIS — M542 Cervicalgia: Secondary | ICD-10-CM | POA: Diagnosis present

## 2024-06-27 ENCOUNTER — Ambulatory Visit (INDEPENDENT_AMBULATORY_CARE_PROVIDER_SITE_OTHER): Admitting: Physical Therapy

## 2024-06-27 DIAGNOSIS — M542 Cervicalgia: Secondary | ICD-10-CM

## 2024-06-27 DIAGNOSIS — M62838 Other muscle spasm: Secondary | ICD-10-CM

## 2024-06-27 NOTE — Therapy (Addendum)
 " OUTPATIENT PHYSICAL THERAPY  TREATMENT   Patient Name: Robin Stephens MRN: [id number] DOB:[birth year], 74 y.o., female Today's Date: 06/27/2024  END OF SESSION:  PT End of Session - 06/27/24 1601     Visit Number 2    Number of Visits 17    Date for Recertification  09/18/24    Authorization Type [employer] MEDICARE CHOICE PPO reporting period from 06/26/2024    Authorization Time Period needs auth    Authorization - Visit Number 2    Authorization - Number of Visits 1    Progress Note Due on Visit 10    PT Start Time 1601    PT Stop Time 1641    PT Time Calculation (min) 40 min    Activity Tolerance Patient tolerated treatment well;No increased pain    Behavior During Therapy [facility] for tasks assessed/performed           Past Medical History:  Diagnosis Date   Age-related osteoporosis without current pathological fracture 11/03/2022   Allergy    Anxiety On occasion   Gastroesophageal reflux disease without esophagitis 11/03/2022   Past Surgical History:  Procedure Laterality Date   APPENDECTOMY  2006   EYE SURGERY     PRK   Patient Active Problem List   Diagnosis Date Noted   Elevated coronary artery calcium  score 09/25/2023   Myalgia due to statin 09/25/2023   Age-related osteoporosis without current pathological fracture 11/03/2022   Gastroesophageal reflux disease without esophagitis 11/03/2022   Family history of heart disease 08/31/2020   Anxiety 01/21/2015   DD (diverticular disease) 01/21/2015   Hypercholesteremia 01/21/2015   Insomnia 01/21/2015    PCP: Myrla Jon HERO, MD  REFERRING PROVIDER: Myrla Jon HERO, MD  REFERRING DIAG: Trapezius muscle spasm  THERAPY DIAG:  Other muscle spasm  Cervicalgia  Rationale for Evaluation and Treatment: Rehabilitation  ONSET DATE: July 2025  SUBJECTIVE:                                                                                                                                                                                                          PERTINENT HISTORY:  Patient is a 74 y.o. female who presents to outpatient physical therapy with a referral for medical diagnosis Trapezius muscle spasm. This patient's chief complaints consist of pain in upper trap more present in left side, leading to the following functional deficits: limitations in ADL, lifting heavy, looking down. Relevant past medical history and comorbidities include the following: she has Anxiety; DD (diverticular disease); Hypercholesteremia; Insomnia; Family history of heart disease;  Age-related osteoporosis without current pathological fracture; Gastroesophageal reflux disease without esophagitis; Elevated coronary artery calcium  score; and Myalgia due to statin on their problem list. she  has a past medical history of Age-related osteoporosis without current pathological fracture (11/03/2022), Allergy, Anxiety (On occasion), and Gastroesophageal reflux disease without esophagitis (11/03/2022). she  has a past surgical history that includes Appendectomy (2006) and Eye surgery.    SUBJECTIVE STATEMENT:  Patient states she is doing good. Her pain feels more than usual because she's not using heating pad as often but used bio-freeze earlier today. She has been doing her HEP. She wants to do dry needling today  PAIN:   Current pain: 3/10, in left upper trap   From initial Eval: Are you having pain? Yes, slightly uncomfortable NPRS: Current: 1-2/10,  Best: 1-2/10, Worst: 7/10. Pain location: upper trap area on both sides but mostly left Pain description: achey pain N/T: none Aggravating factors: lifting heavy things Relieving factors: enjoys using heating pad, litocaine patches, thermo patches that keep it warm, biofreeze throughout day    PRECAUTIONS: None   Patient denies hx of cancer, stroke, seizures, lung problems, heart problems, diabetes, unexplained weight loss, unexplained changes in bowel or bladder problems,  unexplained stumbling or dropping things, and spinal surgery Has osteoporosis  Mentioned taking zoloft  2 wks ago, and felt so dizzy from it that now she's easing off of it wit MD, she said this in regards to being asked about unexplained stumbling.    WEIGHT BEARING RESTRICTIONS: No  FALLS:  Has patient fallen in last 6 months? 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 Hypomobile to CPA at C7-T5 but only localized tenderness to pressure.   TREATMENT    Trigger Point Dry Needling  Initial Treatment: Pt instructed on Dry Needling rational, procedures, and possible side effects. Pt instructed to expect mild to moderate muscle soreness later in the day and/or into the next day.  Pt instructed in methods to reduce muscle soreness. Pt instructed to continue prescribed HEP. Because Dry Needling was performed over or adjacent to a lung field, pt was educated on S/S of pneumothorax and to seek immediate medical attention should they occur.  Patient was educated on signs and symptoms of infection and other risk factors and advised to seek medical attention should they occur.  Patient verbalized understanding of these instructions and education.   Patient Verbal Consent Given: Yes Education Handout Provided: Yes Muscles Treated: Dry needling performed to left and right upper trapezius muscles to decrease pain and spasms along patients neck region with patient in prone utilizing 2 dry needle(s) .25mm x 30mm with 2 sticks at each upper trap. Electrical Stimulation Performed:  No Treatment Response/Outcome: Patient with twitch response L > R and reported expected deep ache. Tolerated well with no unexpected adverse reactions.   Manual therapy: to reduce pain and tissue tension, improve range of motion, neuromodulation, in order to promote improved ability to complete functional activities.  Patient was positioned prone on plinth for CPA of C2-T3 spinous process grade II mobilization. C3-C4 presented  with radiating sx to left upper trap with no improvement with repeated motions. STM performed bilaterally on upper traps.   Seated cervical retraction with very light clinician OP to help pt learn how to perform motion correctly and more effectively reduce neck pain and stiffness 1x6                                                                                               Therapeutic exercise: therapeutic exercises that incorporate ONE parameter at one or more areas of the body to centralize symptoms, develop strength and endurance, range of motion, and flexibility required for successful completion of functional activities.  The following exercises were used to improve cervical mobility and decrease neck pain   Seated Repeated cervical retraction  4 x 10   Patient needed cueing to improve form and execution of exercise   Patient displayed improved rotation ROM on both sides with decrease in pain for R rotation   Seated (back supported) Repeated cervical retraction with patient manually performing retraction with L shape hand   3 x 10  Patient displayed further improved rotation ROM on both sides and slight decrease in pain when performing R rotation Added to HEP with option to provide self overpressure  Seated (back supported) Repeated retraction with cervical extension   1 x 10   She reported no change in sx but appeared to show increase in ROM in right rotation   Seated (back supported) Repeated retraction with self overpressure   1 x 10   She reported no  further change in symptoms or motion  Pt required multimodal cuing for proper technique and to facilitate improved neuromuscular control, strength, range of motion, and functional ability resulting in improved performance and form.   PATIENT EDUCATION:  Education details: Patient educated on how to perform stretch with appropriate technique Person educated: Patient Education method: Medical Illustrator Education comprehension: verbalized understanding and returned demonstration  HOME EXERCISE PROGRAM: Access Code: [email or web address] URL: [email or web address] Date: 06/27/2024 Prepared by: Vernell Mariscal  Exercises - Seated Assisted Cervical Rotation with Towel  - 1-2 x daily - 7 x weekly - 3 sets - 10 reps - 3 second hold - Seated Upper Trapezius Stretch  - 1-2 x daily - 7 x weekly - 3 sets - 10 reps - 3 second hold - Seated Passive Cervical Retraction  - 1 x daily - 3-4 x weekly - 3 sets - 10 reps  ASSESSMENT:  CLINICAL IMPRESSION:   Today session focused on addressing upper trap muscle tightness with dry needling with manual therapy from C2 - T3 spinous process and STM for maximal efficiency in reducing neck stiffness and pain. Patient was introduced to exercises that work on improving her cervical ROM while also decreasing her neck pain and stiffness. She needed multiple cues to understand and perform exercises correctly. By end of session she was able to do exercises on her own and had one added to her HEP. She was encouraged to look in the mirror to ensure proper execution of exercise was being achieved. Patient would benefit from continued management of limiting condition by  skilled physical therapist to address remaining impairments and functional limitations to work towards stated goals and return to PLOF or maximal functional independence.     Mechanical sensitivities: pressure sensitivity, myofascial trigger points    OBJECTIVE IMPAIRMENTS: decreased activity  tolerance, decreased ROM, decreased strength, hypomobility, increased muscle spasms, impaired UE functional use, postural dysfunction, and pain.   ACTIVITY LIMITATIONS: carrying, lifting, bending, sleeping, and sleeping, recreational activities, any activity causing her to look down for long periods causes her pain and discomfort  PARTICIPATION LIMITATIONS: traveling, recreational activities, women's group  PERSONAL FACTORS: Age, Fitness, Past/current experiences, Time since onset of injury/illness/exacerbation, and 3+ comorbidities: osteoporosis, GERD, anxiety are also affecting patient's functional outcome.   REHAB POTENTIAL: Good  CLINICAL DECISION MAKING: Stable/uncomplicated  EVALUATION COMPLEXITY: Low   GOALS: GOALS: Goals reviewed with patient? No  SHORT TERM GOALS: Target date: 07/10/2024  Patient will be independent with initial home exercise program for self-management of symptoms. Baseline: Initial HEP provided at IE (06/26/24); Goal status: MET  LONG TERM GOALS: Target date: 09/18/2024  Patient will be independent with a long-term home exercise program for self-management of symptoms.  Baseline: Initial HEP provided at IE (06/26/24); Goal status: In progress  2.  Patient will demonstrate improved Neck Disability Index (NDI) to equal or less than 10% to demonstrate improvement in overall condition and self-reported functional ability.  Baseline: 24% (06/26/24); Goal status: In progress  3.  Patient will improve overall cervical ROM to decrease limitations in reading, sleeping, and viewing her surroundings.  Baseline: limited in flexion and right rotation - see objective (06/26/24); Goal status: In progress  4.  Patient will demonstrate improved strength and tolerance to lifting heavier weights without reproduction of pain or compensatory movements by being able to complete 10 consecutive OH presses with a difficult weight as defined by the patient without increased  concordant symptoms to improve her ability to complete exercise training and travel. Baseline: to be measured at visit 2 as appropriate (06/26/24); Goal status: In progress  5.  Patient will demonstrate improvement in Patient Specific Functional Scale (PSFS) of equal or greater than 8/10 points to reflect clinically significant improvement in patient's most valued functional activities. Baseline: to be measured at visit 2 as appropriate (06/26/24); Goal status: In progress    PLAN:  PT FREQUENCY: 1-2x/week  PT DURATION: 12 weeks  PLANNED INTERVENTIONS: 97164- PT Re-evaluation, 97750- Physical Performance Testing, 97110-Therapeutic exercises, 97530- Therapeutic activity, [id number]- Neuromuscular re-education, 97535- Self Care, 02859- Manual therapy, [id number]- Electrical stimulation (unattended), 20560 (1-2 muscles), 20561 (3+ muscles)- Dry Needling, Patient/Family education, Joint mobilization, Cryotherapy, and Moist heat  PLAN FOR NEXT SESSION: Review how dry needling session felt for her the following days, Review updated HEP, Consider asking about abuse/literacy questions, take patient BP, take PSFS for long term goals, Continue with cervical retraction exercises to improve ROM and decrease stiffness.   Vernell Mariscal SPT  Student physical therapist under direct supervision of licensed physical therapists during the entirety of the session.   All dry needling performed exclusively by licensed physical therapist, who has advanced training in this intervention.   Camie SAUNDERS. Juli, PT, DPT, Cert. MDT, PRA-C 06/27/24, 6:01 PM  [facility] [address] [place], [place] [address] P: [phone] I F: [phone]     "

## 2024-06-28 NOTE — Patient Instructions (Signed)

## 2024-07-10 ENCOUNTER — Ambulatory Visit: Admitting: Physical Therapy

## 2024-07-15 ENCOUNTER — Ambulatory Visit: Admitting: Physical Therapy

## 2024-07-17 ENCOUNTER — Ambulatory Visit: Admitting: Physical Therapy

## 2024-07-22 ENCOUNTER — Ambulatory Visit: Admitting: Physical Therapy

## 2024-07-24 ENCOUNTER — Ambulatory Visit: Admitting: Physical Therapy

## 2024-07-29 ENCOUNTER — Ambulatory Visit: Admitting: Physical Therapy

## 2024-08-01 ENCOUNTER — Ambulatory Visit: Admitting: Physical Therapy

## 2024-08-05 ENCOUNTER — Ambulatory Visit: Admitting: Physical Therapy

## 2024-08-07 ENCOUNTER — Ambulatory Visit: Admitting: Physical Therapy

## 2024-08-12 ENCOUNTER — Ambulatory Visit: Admitting: Physical Therapy

## 2024-08-14 ENCOUNTER — Ambulatory Visit: Admitting: Physical Therapy

## 2024-08-19 ENCOUNTER — Ambulatory Visit: Admitting: Physical Therapy

## 2024-08-22 ENCOUNTER — Ambulatory Visit: Admitting: Physical Therapy

## 2024-08-26 ENCOUNTER — Ambulatory Visit: Admitting: Physical Therapy

## 2024-08-28 ENCOUNTER — Ambulatory Visit: Admitting: Physical Therapy

## 2024-09-02 ENCOUNTER — Ambulatory Visit: Admitting: Physical Therapy

## 2024-09-04 ENCOUNTER — Ambulatory Visit: Admitting: Physical Therapy

## 2024-09-11 ENCOUNTER — Ambulatory Visit: Admitting: Physical Therapy

## 2024-09-16 ENCOUNTER — Ambulatory Visit: Admitting: Physical Therapy

## 2024-09-25 ENCOUNTER — Ambulatory Visit

## 2024-09-26 ENCOUNTER — Encounter: Admitting: Family Medicine

## 2024-10-24 ENCOUNTER — Encounter: Admitting: Family Medicine
